# Patient Record
Sex: Female | Born: 1990
Health system: Southern US, Community
[De-identification: ages and names within clinical notes are randomized; demographics above are authoritative.]

## PROBLEM LIST (undated history)

## (undated) DIAGNOSIS — D649 Anemia, unspecified: Secondary | ICD-10-CM

## (undated) HISTORY — PX: MOLE REMOVAL: SHX2046

## (undated) HISTORY — PX: TOE SURGERY: SHX1073

---

## 2011-08-11 DIAGNOSIS — Z3490 Encounter for supervision of normal pregnancy, unspecified, unspecified trimester: Secondary | ICD-10-CM

## 2015-10-03 ENCOUNTER — Encounter (HOSPITAL_COMMUNITY): Payer: Self-pay | Admitting: Emergency Medicine

## 2015-10-03 ENCOUNTER — Ambulatory Visit (HOSPITAL_COMMUNITY)
Admission: EM | Admit: 2015-10-03 | Discharge: 2015-10-03 | Disposition: A | Discharge status: Home / Self Care | Payer: Self-pay | Attending: Family Medicine | Admitting: Family Medicine

## 2015-10-03 DIAGNOSIS — S39012A Strain of muscle, fascia and tendon of lower back, initial encounter: Secondary | ICD-10-CM

## 2015-10-03 MED ORDER — CYCLOBENZAPRINE HCL 5 MG PO TABS: ORAL_TABLET | ORAL | Status: DC

## 2015-10-03 MED ORDER — NAPROXEN 500 MG PO TABS: 500.0000 mg | ORAL_TABLET | Freq: Two times a day (BID) | ORAL | Status: DC

## 2015-10-03 NOTE — ED Notes (Signed)
[facility] with a complaint of bilateral lower back pain that has been ongoing for some time. 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 She stated that she has tried Ibuprofen and it provides minimal relief.

## 2015-10-03 NOTE — Discharge Instructions (Signed)

## 2015-10-03 NOTE — ED Provider Notes (Signed)
CSN: [id number]     Arrival date & time 10/03/15  1753 History   None    Chief Complaint  Patient presents with  . Back Pain   (Consider location/radiation/quality/duration/timing/severity/associated sxs/prior Treatment) Patient is a 25 y.o. female presenting with back pain. The history is provided by the patient.  Back Pain Location:  Lumbar spine Quality:  Aching Radiates to:  Does not radiate Pain severity:  Moderate Pain is:  Same all the time Onset quality:  Gradual Timing:  Constant Progression:  Worsening Chronicity:  Recurrent Context: physical stress   Relieved by:  Nothing Worsened by:  Twisting Ineffective treatments:  NSAIDs   History reviewed. No pertinent past medical history. History reviewed. No pertinent past surgical history. History reviewed. No pertinent family history. Social History  Substance Use Topics  . Smoking status: Never Smoker   . Smokeless tobacco: None  . Alcohol Use: No   OB History    No data available     Review of Systems  Constitutional: Negative.   HENT: Negative.   Eyes: Negative.   Respiratory: Negative.   Cardiovascular: Negative.   Gastrointestinal: Negative.   Endocrine: Negative.   Genitourinary: Negative.   Musculoskeletal: Positive for back pain.  Skin: Negative.   Allergic/Immunologic: Negative.   Neurological: Negative.   Hematological: Negative.   Psychiatric/Behavioral: Negative.     Allergies  Review of patient's allergies indicates no known allergies.  Home Medications   Prior to Admission medications   Medication Sig Start Date End Date Taking? Authorizing Provider  cyclobenzaprine (FLEXERIL) 5 MG tablet One po bid prn 10/03/15   Deatra CanterWilliam J Oxford, FNP  estradiol cypionate (DEPO-ESTRADIOL) 5 MG/ML injection Inject into the muscle every 28 (twenty-eight) days.    Historical Provider, MD  naproxen (NAPROSYN) 500 MG tablet Take 1 tablet (500 mg total) by mouth 2 (two) times daily. 10/03/15   Deatra CanterWilliam J  Oxford, FNP   Meds Ordered and Administered this Visit  Medications - No data to display  BP 118/63 mmHg  Pulse 88  Temp(Src) 98.4 F (36.9 C) (Oral)  Resp 12  SpO2 100% No data found.   Physical Exam  Constitutional: She appears well-developed and well-nourished.  HENT:  Head: Normocephalic and atraumatic.  Eyes: Pupils are equal, round, and reactive to light.  Neck: Normal range of motion.  Cardiovascular: Normal rate, regular rhythm and normal heart sounds.   Pulmonary/Chest: Effort normal and breath sounds normal.  Musculoskeletal: She exhibits tenderness.  TTP bilateral lumbar paraspinous muscles.  Decreased ROM lumbar spine.      ED Course  Procedures (including critical care time)  Labs Review Labs Reviewed - No data to display  Imaging Review No results found.   Visual Acuity Review  Right Eye Distance:   Left Eye Distance:   Bilateral Distance:    Right Eye Near:   Left Eye Near:    Bilateral Near:         MDM  Lumbar Strain  Naprosyn 500mg  one po bid x 10 days #20 Flexeril 5mg  one po bid prn #20 Need to take off work for 3 days - note given Follow up with PCP    Deatra CanterWilliam J Oxford, FNP 10/03/15 1851

## 2018-01-30 DIAGNOSIS — R5382 Chronic fatigue, unspecified: Secondary | ICD-10-CM | POA: Diagnosis not present

## 2018-07-11 DIAGNOSIS — Z3009 Encounter for other general counseling and advice on contraception: Secondary | ICD-10-CM | POA: Diagnosis not present

## 2018-07-11 DIAGNOSIS — R87612 Low grade squamous intraepithelial lesion on cytologic smear of cervix (LGSIL): Secondary | ICD-10-CM | POA: Diagnosis not present

## 2018-07-12 ENCOUNTER — Encounter (HOSPITAL_COMMUNITY): Payer: Self-pay | Admitting: *Deleted

## 2018-07-22 ENCOUNTER — Telehealth (HOSPITAL_COMMUNITY): Payer: Self-pay | Admitting: *Deleted

## 2018-07-22 ENCOUNTER — Encounter (HOSPITAL_COMMUNITY): Payer: Self-pay | Admitting: *Deleted

## 2018-07-22 NOTE — Telephone Encounter (Signed)
Telephoned patient at home number. Confirmed appointment for March 31. No symptoms of COVID-19. No travel outside of [place] in the last 14 days. No contact with someone with a confirmed diagnosis of COVID-19   

## 2018-07-23 ENCOUNTER — Ambulatory Visit (HOSPITAL_COMMUNITY): Payer: Self-pay

## 2018-08-19 ENCOUNTER — Ambulatory Visit: Payer: Self-pay | Admitting: Family Medicine

## 2018-10-17 ENCOUNTER — Ambulatory Visit (HOSPITAL_COMMUNITY): Payer: Self-pay

## 2018-10-21 ENCOUNTER — Ambulatory Visit: Payer: Self-pay | Admitting: Family Medicine

## 2018-10-22 ENCOUNTER — Ambulatory Visit (HOSPITAL_COMMUNITY): Payer: Self-pay

## 2018-11-05 ENCOUNTER — Encounter (HOSPITAL_COMMUNITY): Payer: Self-pay

## 2018-11-05 ENCOUNTER — Ambulatory Visit (HOSPITAL_COMMUNITY)
Admission: RE | Admit: 2018-11-05 | Discharge: 2018-11-05 | Disposition: A | Discharge status: Home / Self Care | Payer: Medicaid Other | Source: Ambulatory Visit | Attending: Obstetrics and Gynecology | Admitting: Obstetrics and Gynecology

## 2018-11-05 ENCOUNTER — Other Ambulatory Visit: Payer: Self-pay

## 2018-11-05 DIAGNOSIS — Z1239 Encounter for other screening for malignant neoplasm of breast: Secondary | ICD-10-CM | POA: Insufficient documentation

## 2018-11-05 DIAGNOSIS — R87612 Low grade squamous intraepithelial lesion on cytologic smear of cervix (LGSIL): Secondary | ICD-10-CM

## 2018-11-05 HISTORY — DX: Anemia, unspecified: D64.9

## 2018-11-05 NOTE — Patient Instructions (Signed)
Explained breast self awareness with Kathy Browning. Patient did not need a Pap smear today due to last Pap smear was 07/09/2018. Explained the colposcopy to patient the recommended follow-up for her abnormal Pap smear. Referred patient to the [facility] for a colposcopy to follow-up for her abnormal Pap smear. Appointment scheduled for Wednesday, November 06, 2018 at 1355. Patient aware of appointment and will be there. Let patient know a screening mammogram is recommended at age 29 unless clinically indicated prior. Discussed smoking cessation with patient. Referred to the [facility] Quitline and gave resources to the free smoking cessation classes at [facility]. Kathy Browning verbalized understanding.  Carlee Tesfaye, Arvil Chaco, RN 3:53 PM

## 2018-11-05 NOTE — Progress Notes (Signed)
Patient referred to Kathy Browning by the [facility] Department due to patient had an abnormal Pap smear on 07/09/2018 that a colposcopy is recommended for follow-up.  Pap Smear: Pap smear not completed today. Last Pap smear was 07/09/2018 at the [facility] and [facility]. Referred patient to the [facility] [place] for a colposcopy to follow-up for her abnormal Pap smear. Appointment scheduled for Wednesday, November 06, 2018 at 1355. Per patient has no history of an abnormal Pap smear prior to her most recent Pap smear. Last Pap smear result is in Epic.  Physical exam: Breasts Breasts symmetrical. No skin abnormalities bilateral breasts. No nipple retraction bilateral breasts. No nipple discharge bilateral breasts. No lymphadenopathy. No lumps palpated bilateral breasts. No complaints of pain or tenderness on exam. Screening mammogram recommended at age 83 unless clinically indicated prior.      Pelvic/Bimanual No Pap smear completed today since last Pap smear was 07/09/2018. Pap smear not indicated per BCCCP guidelines.   Smoking History: Patient is a current smoker. Discussed smoking cessation with patient. Referred to the [facility] Quitline and gave resources to the free smoking cessation classes at [facility].  Patient Navigation: Patient education provided. Access to services provided for patient through BCCCP program.   Breast and Cervical Cancer Risk Assessment: Patient has a family history of a maternal aunt having breast cancer. Patient has no known genetic mutations or history of radiation treatment to the chest before age 28. Patient has no history of cervical dysplasia, immunocompromised, or DES exposure in-utero. Breast cancer risk assessment completed. No breast cancer risk calculated due to patient is less than 78 years old.

## 2018-11-06 ENCOUNTER — Ambulatory Visit (INDEPENDENT_AMBULATORY_CARE_PROVIDER_SITE_OTHER): Payer: Self-pay | Admitting: Obstetrics & Gynecology

## 2018-11-06 ENCOUNTER — Other Ambulatory Visit (HOSPITAL_COMMUNITY)
Admission: RE | Admit: 2018-11-06 | Discharge: 2018-11-06 | Disposition: A | Discharge status: Home / Self Care | Payer: 59 | Source: Ambulatory Visit | Attending: Obstetrics & Gynecology | Admitting: Obstetrics & Gynecology

## 2018-11-06 VITALS — BP 130/90 | HR 90 | Temp 98.5°F | Wt 132.2 lb

## 2018-11-06 DIAGNOSIS — R87612 Low grade squamous intraepithelial lesion on cytologic smear of cervix (LGSIL): Secondary | ICD-10-CM | POA: Diagnosis not present

## 2018-11-06 LAB — POCT PREGNANCY, URINE: Preg Test, Ur: NEGATIVE

## 2018-11-06 NOTE — Progress Notes (Signed)
   Subjective:    Patient ID: Kathy Browning, female    DOB: Feb 27, 1991, 28 y.o.   MRN: [id number]  HPI 28 yo single P1 here for a colpo due to a LGSIL pap. She reports that she had the Gardasil series in the past.  Review of Systems She uses condoms for contraception.    Objective:   Physical Exam Breathing, conversing, and ambulating normally Well nourished, well hydrated Black female, no apparent distress UPT negative, consent signed, time out done Cervix prepped with acetic acid. Transformation zone seen in its entirety. Colpo adequate. Changes c/w LGSIL seen in a circumferencial fashion at the os (acetowhite changes) ECC obtained. She tolerated the procedure well.     Assessment & Plan:  LGSIL pap- c/w colpo finding Await ECC

## 2018-11-06 NOTE — Addendum Note (Signed)
Addended by: Emily Filbert on: 11/06/2018 02:42 PM   Modules accepted: Orders

## 2018-11-07 ENCOUNTER — Encounter: Payer: Self-pay | Admitting: *Deleted

## 2018-11-13 ENCOUNTER — Encounter (HOSPITAL_COMMUNITY): Payer: Self-pay | Admitting: *Deleted

## 2018-11-25 ENCOUNTER — Telehealth (HOSPITAL_COMMUNITY): Payer: Self-pay | Admitting: *Deleted

## 2018-11-25 NOTE — Telephone Encounter (Signed)
Telephoned patient at home number and left message to return call. Advised patient may reach [facility] at [phone] for results.

## 2020-04-06 ENCOUNTER — Emergency Department (HOSPITAL_COMMUNITY): Payer: Medicaid Other

## 2020-04-06 ENCOUNTER — Emergency Department (HOSPITAL_COMMUNITY)
Admission: EM | Admit: 2020-04-06 | Discharge: 2020-04-06 | Disposition: A | Discharge status: Home / Self Care | Payer: Medicaid Other | Attending: Emergency Medicine | Admitting: Emergency Medicine

## 2020-04-06 ENCOUNTER — Other Ambulatory Visit: Payer: Self-pay

## 2020-04-06 ENCOUNTER — Encounter (HOSPITAL_COMMUNITY): Payer: Self-pay

## 2020-04-06 DIAGNOSIS — M542 Cervicalgia: Secondary | ICD-10-CM | POA: Diagnosis not present

## 2020-04-06 DIAGNOSIS — S99921A Unspecified injury of right foot, initial encounter: Secondary | ICD-10-CM

## 2020-04-06 DIAGNOSIS — F17210 Nicotine dependence, cigarettes, uncomplicated: Secondary | ICD-10-CM | POA: Diagnosis not present

## 2020-04-06 MED ORDER — NAPROXEN 500 MG PO TABS: 500.0000 mg | ORAL_TABLET | Freq: Two times a day (BID) | ORAL | 0 refills | Status: AC

## 2020-04-06 NOTE — Progress Notes (Signed)
Orthopedic Tech Progress Note Patient Details:  Kathy Browning [birth year] [id number]  Ortho Devices Ortho Device/Splint Location: applied aso to RLE and crutches Ortho Device/Splint Interventions: Ordered,Application,Adjustment   Post Interventions Patient Tolerated: Well Instructions Provided: Care of device   Jennye Moccasin 04/06/2020, 9:17 PM
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
## 2020-04-13 ENCOUNTER — Ambulatory Visit (HOSPITAL_COMMUNITY)
Admission: RE | Admit: 2020-04-13 | Discharge: 2020-04-13 | Disposition: A | Discharge status: Home / Self Care | Payer: Self-pay | Source: Ambulatory Visit | Attending: Urgent Care | Admitting: Urgent Care

## 2020-04-13 ENCOUNTER — Telehealth: Payer: Self-pay | Admitting: Podiatry

## 2020-04-13 ENCOUNTER — Other Ambulatory Visit: Payer: Self-pay

## 2020-04-13 ENCOUNTER — Ambulatory Visit (INDEPENDENT_AMBULATORY_CARE_PROVIDER_SITE_OTHER): Payer: Self-pay

## 2020-04-13 ENCOUNTER — Encounter (HOSPITAL_COMMUNITY): Payer: Self-pay

## 2020-04-13 VITALS — BP 130/81 | HR 118 | Temp 98.8°F | Resp 17

## 2020-04-13 DIAGNOSIS — M79671 Pain in right foot: Secondary | ICD-10-CM

## 2020-04-13 DIAGNOSIS — S92244A Nondisplaced fracture of medial cuneiform of right foot, initial encounter for closed fracture: Secondary | ICD-10-CM

## 2020-04-13 MED ORDER — HYDROCODONE-ACETAMINOPHEN 5-325 MG PO TABS: 1.0000 | ORAL_TABLET | Freq: Four times a day (QID) | ORAL | 0 refills | Status: DC | PRN

## 2020-04-13 MED ORDER — NAPROXEN 500 MG PO TABS: 500.0000 mg | ORAL_TABLET | Freq: Two times a day (BID) | ORAL | 0 refills | Status: DC
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
## 2020-04-13 NOTE — Discharge Instructions (Signed)
Please schedule naproxen twice daily with food for your severe pain.  If you still have pain despite taking naproxen regularly, this is breakthrough pain.  You can use hydrocodone, a narcotic pain medicine, once every 4-6 hours for this.  Once your pain is better controlled, switch back to just naproxen.    [facility] () Podiatrist in [place], [place] COVID-19 info: [email or web address] Get online care: [email or web address] Address: [address] [place], [place], [place] [address] Phone: [phone] Appointments: [email or web address]   [facility], [place], [place] Doctor in [place], [place] Address: [address] [place], [place] [address] Phone: [phone]
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
## 2020-04-26 ENCOUNTER — Other Ambulatory Visit: Payer: Self-pay

## 2020-04-26 ENCOUNTER — Encounter: Payer: Self-pay | Admitting: Podiatry

## 2020-04-26 ENCOUNTER — Ambulatory Visit: Payer: Medicaid Other | Admitting: Podiatry

## 2020-04-26 ENCOUNTER — Ambulatory Visit: Payer: Medicaid Other

## 2020-04-26 DIAGNOSIS — S99921A Unspecified injury of right foot, initial encounter: Secondary | ICD-10-CM

## 2020-04-26 DIAGNOSIS — S92244A Nondisplaced fracture of medial cuneiform of right foot, initial encounter for closed fracture: Secondary | ICD-10-CM

## 2020-04-26 DIAGNOSIS — S93621A Sprain of tarsometatarsal ligament of right foot, initial encounter: Secondary | ICD-10-CM
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
## 2020-04-26 NOTE — Patient Instructions (Signed)
Lisfranc Injury  A Lisfranc injury is a break (fracture), separation (dislocation), or sprain involving the bones and joints in the middle of your foot. This is also known as midfoot injury. Your midfoot is made up of a cluster of small bones (tarsals)that attach to the long bones going to your toes (metatarsals). Strong bands of tissue (ligaments) attach the bones of your midfoot to each other. A Lisfranc injury can include:  Ligament damage or tears.  Bone fractures.  Dislocations.  A combination of these injuries. This type of injury can cause foot pain and instability. The condition can range from mild to severe. What are the causes? This condition may be caused by:  An injury that twists your foot forcefully.  Tripping or falling over your foot.  Falling from a height.  A hard, direct hit or a crushing injury to your foot. What increases the risk? You are more likely to develop this condition if you participate in:  Contact sports, especially while wearing cleats. This includes football.  Activities in which your foot is loaded in a pointed position, like dance and gymnastics. What are the signs or symptoms? Symptoms of this condition include:  Pain.  Swelling.  Bruising.  Seeing or feeling a new bump on the top of your foot. How is this diagnosed? This condition is diagnosed based on your symptoms, your medical history, and a physical exam. You may also have imaging studies done, including:  X-rays.  CT scans.  MRI. How is this treated? The first treatments for ligament injuries that do not cause instability or dislocation of the midfoot are usually nonsurgical. These may include:  Using a boot or cast to keep your foot still and protect it while it heals (immobilization).  Using crutches to keep weight off your foot.  Doing physical therapy to improve motion and strength.  Taking medicine for pain. Surgery is the treatment for fractures, dislocations,  and unstable ligament injuries. This may include ligament repair, joint fusion, or a procedure to stabilize the fracture using screws or plates. After surgery, you will have to wear a cast and eventually have physical therapy. Follow these instructions at home: Medicines  Take over-the-counter and prescription medicines only as told by your health care provider.  Ask your health care provider if the medicine prescribed to you requires you to avoid driving or using heavy machinery. If you have a boot:  Wear the boot as told by your health care provider. Remove it only as told by your health care provider.  Loosen the boot if your toes tingle, become numb, or turn cold and blue.  Keep the boot clean and dry. If you have a cast:  Do not stick anything inside the cast to scratch your skin. Doing that increases your risk of infection.  Check the skin around the cast every day. Tell your health care provider about any concerns.  You may put lotion on dry skin around the edges of the cast. Do not put lotion on the skin underneath the cast.  Keep the cast clean and dry.  Do not put pressure on any part of the cast until it is fully hardened. This may take several hours. Bathing  Do not take baths, swim, or use a hot tub until your health care provider approves.  If the cast or boot is not waterproof: ? Do not let it get wet. ? Cover it with a watertight covering when you take a bath or shower. Managing pain, stiffness, and  swelling   If directed, put ice on the injured area. ? If you have a removable boot, remove it as told by your health care provider. ? Put ice in a plastic bag. ? Place a towel between your skin and the bag or between your cast and the bag. ? Leave the ice on for 20 minutes, 2-3 times a day.  Move your toes often to reduce stiffness and swelling.  Raise (elevate) the injured area above the level of your heart while you are sitting or lying down. Activity  Do  not use the injured limb to support your body weight until your health care provider says that you can. Use crutches as told by your health care provider.  Do exercises as told by your health care provider.  Return to your normal activities as told by your health care provider. Ask your health care provider what activities are safe for you. General instructions  Take over-the-counter and prescription medicines only as told by your health care provider.  Ask your health care provider when it is safe to drive if you have a boot or cast on your foot.  Do not use any products that contain nicotine or tobacco, such as cigarettes, e-cigarettes, and chewing tobacco. These can delay bone healing. If you need help quitting, ask your health care provider.  Keep all follow-up visits as told by your health care provider. This is important. How is this prevented?  Warm up and stretch before being active.  Cool down and stretch after being active.  Use footwear that is appropriate for your athletic activity and the playing surface.  Be safe and responsible while being active. This will help you avoid falls. Contact a health care provider if:  Your pain medicine and rest are not helping. Get help right away if:  Your foot becomes very painful or numb.  Your toes become very pale or turn blue. Summary  A Lisfranc injury is a break (fracture), separation (dislocation), or sprain involving the bones and joints in the middle of your foot. This is also known as midfoot injury.  You are more likely to develop this condition if you participate in contact sports or activities in which your foot is loaded in a pointed position, like dance and gymnastics.  This type of injury can cause foot pain and instability. The condition can range from mild to severe.  This condition is treated with a boot or cast, physical therapy, crutches, medicine for pain, and surgery if needed.  Get help right away if your  foot becomes very painful or numb or if your toes become very pale or turn blue. This information is not intended to replace advice given to you by your health care provider. Make sure you discuss any questions you have with your health care provider. Document Revised: 08/02/2018 Document Reviewed: 03/07/2018 Elsevier Patient Education  2020 [employer].

## 2020-04-28 ENCOUNTER — Telehealth: Payer: Self-pay | Admitting: Podiatry

## 2020-04-28 ENCOUNTER — Encounter: Payer: Self-pay | Admitting: Podiatry

## 2020-04-28 NOTE — Telephone Encounter (Signed)
Patients job needs to know how long she will need to be in boot. How long she needs to have restrictions at work. Please advise?

## 2020-04-28 NOTE — Telephone Encounter (Signed)
Please go ahead and do 6 weeks for now

## 2020-04-29 ENCOUNTER — Telehealth: Payer: Self-pay | Admitting: *Deleted

## 2020-04-29 NOTE — Telephone Encounter (Signed)
Patient has an appointment on 05/07/2020 at 4:30 pm at [facility]. Kathy Browning

## 2020-04-29 NOTE — Telephone Encounter (Signed)
-----   Message from Vivi Barrack, DPM sent at 04/26/2020 12:03 PM EST ----- I have ordered a CT scan if you could please follow up on this. Thank you!

## 2020-05-07 ENCOUNTER — Ambulatory Visit
Admission: RE | Admit: 2020-05-07 | Discharge: 2020-05-07 | Disposition: A | Discharge status: Home / Self Care | Payer: Self-pay | Source: Ambulatory Visit | Attending: Podiatry | Admitting: Podiatry

## 2020-05-07 DIAGNOSIS — S93621A Sprain of tarsometatarsal ligament of right foot, initial encounter: Secondary | ICD-10-CM

## 2020-05-17 ENCOUNTER — Ambulatory Visit (INDEPENDENT_AMBULATORY_CARE_PROVIDER_SITE_OTHER): Payer: Medicaid Other | Admitting: Podiatry

## 2020-05-17 ENCOUNTER — Other Ambulatory Visit: Payer: Self-pay

## 2020-05-17 DIAGNOSIS — S93324A Dislocation of tarsometatarsal joint of right foot, initial encounter: Secondary | ICD-10-CM | POA: Diagnosis not present

## 2020-05-17 DIAGNOSIS — S92221A Displaced fracture of lateral cuneiform of right foot, initial encounter for closed fracture: Secondary | ICD-10-CM | POA: Diagnosis not present

## 2020-05-18 ENCOUNTER — Encounter: Payer: Self-pay | Admitting: *Deleted

## 2020-05-18 ENCOUNTER — Encounter: Payer: Self-pay | Admitting: Podiatry
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
## 2020-05-24 ENCOUNTER — Ambulatory Visit: Payer: Medicaid Other | Admitting: Podiatry

## 2020-05-25 ENCOUNTER — Ambulatory Visit: Payer: Medicaid Other | Admitting: Podiatry

## 2020-05-31 ENCOUNTER — Telehealth: Payer: Self-pay | Admitting: Podiatry
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
## 2020-06-02 ENCOUNTER — Encounter: Payer: Medicaid Other | Admitting: Student

## 2020-06-07 ENCOUNTER — Other Ambulatory Visit: Payer: Self-pay

## 2020-06-07 ENCOUNTER — Encounter: Payer: Self-pay | Admitting: Podiatry

## 2020-06-07 ENCOUNTER — Ambulatory Visit (INDEPENDENT_AMBULATORY_CARE_PROVIDER_SITE_OTHER): Payer: Medicaid Other

## 2020-06-07 ENCOUNTER — Ambulatory Visit (INDEPENDENT_AMBULATORY_CARE_PROVIDER_SITE_OTHER): Payer: Medicaid Other | Admitting: Podiatry

## 2020-06-07 DIAGNOSIS — S92244D Nondisplaced fracture of medial cuneiform of right foot, subsequent encounter for fracture with routine healing: Secondary | ICD-10-CM

## 2020-06-07 DIAGNOSIS — S92221D Displaced fracture of lateral cuneiform of right foot, subsequent encounter for fracture with routine healing: Secondary | ICD-10-CM | POA: Diagnosis not present

## 2020-06-07 DIAGNOSIS — S93324D Dislocation of tarsometatarsal joint of right foot, subsequent encounter: Secondary | ICD-10-CM
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
## 2020-06-07 NOTE — Patient Instructions (Signed)
A physical therapy referral will be sent to:   [address] [place],  [place], [place] [address]   [phone]

## 2020-06-22 ENCOUNTER — Ambulatory Visit: Payer: Medicaid Other | Attending: Podiatry

## 2020-06-22 ENCOUNTER — Other Ambulatory Visit: Payer: Self-pay

## 2020-06-22 DIAGNOSIS — M6281 Muscle weakness (generalized): Secondary | ICD-10-CM | POA: Diagnosis present

## 2020-06-22 DIAGNOSIS — M25571 Pain in right ankle and joints of right foot: Secondary | ICD-10-CM | POA: Insufficient documentation
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
## 2020-06-22 NOTE — Patient Instructions (Signed)
Access Code: [email or web address] URL: [email or web address] Date: 06/22/2020 Prepared by: Gardiner Rhyme  Exercises Seated Toe Towel Scrunches - 2 x daily - 7 x weekly - 1 sets - 10 reps Seated Arch Lifts - 3-5 x daily - 7 x weekly - 1-2 sets - 10 reps - 5 seconds hold Toe Spreading - 2 x daily - 7 x weekly - 1 sets - 10 reps

## 2020-06-28 ENCOUNTER — Ambulatory Visit: Payer: Medicaid Other | Admitting: Physical Therapy

## 2020-07-05 ENCOUNTER — Ambulatory Visit (INDEPENDENT_AMBULATORY_CARE_PROVIDER_SITE_OTHER): Payer: Medicaid Other | Admitting: Podiatry

## 2020-07-05 ENCOUNTER — Encounter: Payer: Self-pay | Admitting: Podiatry

## 2020-07-05 ENCOUNTER — Other Ambulatory Visit: Payer: Self-pay

## 2020-07-05 DIAGNOSIS — S92244D Nondisplaced fracture of medial cuneiform of right foot, subsequent encounter for fracture with routine healing: Secondary | ICD-10-CM

## 2020-07-05 DIAGNOSIS — S92221D Displaced fracture of lateral cuneiform of right foot, subsequent encounter for fracture with routine healing: Secondary | ICD-10-CM | POA: Diagnosis not present

## 2020-07-05 DIAGNOSIS — S93324D Dislocation of tarsometatarsal joint of right foot, subsequent encounter: Secondary | ICD-10-CM | POA: Diagnosis not present

## 2020-07-07 ENCOUNTER — Ambulatory Visit: Payer: Medicaid Other | Admitting: Rehabilitative and Restorative Service Providers"

## 2020-07-07 ENCOUNTER — Encounter: Payer: Self-pay | Admitting: Rehabilitative and Restorative Service Providers"

## 2020-07-07 ENCOUNTER — Other Ambulatory Visit: Payer: Self-pay

## 2020-07-07 DIAGNOSIS — M6281 Muscle weakness (generalized): Secondary | ICD-10-CM

## 2020-07-07 DIAGNOSIS — M25571 Pain in right ankle and joints of right foot: Secondary | ICD-10-CM
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
## 2020-07-13 ENCOUNTER — Ambulatory Visit: Payer: Medicaid Other

## 2020-07-22 ENCOUNTER — Ambulatory Visit: Payer: Medicaid Other

## 2020-07-28 ENCOUNTER — Ambulatory Visit: Payer: Medicaid Other | Admitting: Rehabilitative and Restorative Service Providers"

## 2020-08-04 ENCOUNTER — Telehealth: Payer: Self-pay | Admitting: Rehabilitative and Restorative Service Providers"

## 2020-08-04 ENCOUNTER — Ambulatory Visit: Payer: Medicaid Other | Attending: Podiatry | Admitting: Rehabilitative and Restorative Service Providers"

## 2020-08-04 NOTE — Telephone Encounter (Signed)
PT phoned patient and let her know that she missed her appt today. She said she has had to cancel several b/c she couldn't get off of work. PT reminded her of her next appt. She acknowledged the next appt.

## 2020-08-10 ENCOUNTER — Ambulatory Visit: Payer: Medicaid Other

## 2020-08-16 ENCOUNTER — Ambulatory Visit (INDEPENDENT_AMBULATORY_CARE_PROVIDER_SITE_OTHER): Payer: Medicaid Other | Admitting: Podiatry

## 2020-08-16 ENCOUNTER — Other Ambulatory Visit: Payer: Self-pay

## 2020-08-16 DIAGNOSIS — S93324D Dislocation of tarsometatarsal joint of right foot, subsequent encounter: Secondary | ICD-10-CM | POA: Diagnosis not present

## 2020-08-16 DIAGNOSIS — S92221D Displaced fracture of lateral cuneiform of right foot, subsequent encounter for fracture with routine healing: Secondary | ICD-10-CM

## 2020-08-16 DIAGNOSIS — S92244D Nondisplaced fracture of medial cuneiform of right foot, subsequent encounter for fracture with routine healing: Secondary | ICD-10-CM

## 2020-08-16 NOTE — Patient Instructions (Signed)
Look for the [facility] Physician Fee Schedule for [place] for CPT code 93818 for midtarsal arthrodesis. This would be the maximum amount paid to my office for the surgery. There are additional costs from the surgery center and the anesthesia group that would be more difficult to estimate.

## 2020-08-17 ENCOUNTER — Encounter: Payer: Self-pay | Admitting: Podiatry
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
## 2020-10-19 ENCOUNTER — Ambulatory Visit: Payer: Medicaid Other | Admitting: Podiatry

## 2020-11-18 ENCOUNTER — Ambulatory Visit (INDEPENDENT_AMBULATORY_CARE_PROVIDER_SITE_OTHER): Payer: Medicaid Other | Admitting: Podiatry

## 2020-11-18 ENCOUNTER — Other Ambulatory Visit: Payer: Self-pay

## 2020-11-18 DIAGNOSIS — S92221D Displaced fracture of lateral cuneiform of right foot, subsequent encounter for fracture with routine healing: Secondary | ICD-10-CM

## 2020-11-18 DIAGNOSIS — S93324D Dislocation of tarsometatarsal joint of right foot, subsequent encounter: Secondary | ICD-10-CM | POA: Diagnosis not present

## 2020-11-18 DIAGNOSIS — S92244D Nondisplaced fracture of medial cuneiform of right foot, subsequent encounter for fracture with routine healing: Secondary | ICD-10-CM | POA: Diagnosis not present
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
## 2021-01-11 DIAGNOSIS — M79676 Pain in unspecified toe(s): Secondary | ICD-10-CM

## 2021-04-19 ENCOUNTER — Ambulatory Visit (INDEPENDENT_AMBULATORY_CARE_PROVIDER_SITE_OTHER): Payer: Medicaid Other | Admitting: *Deleted

## 2021-04-19 ENCOUNTER — Other Ambulatory Visit: Payer: Self-pay

## 2021-04-19 VITALS — BP 122/71 | HR 93 | Temp 97.6°F | Ht 63.0 in | Wt 140.2 lb

## 2021-04-19 DIAGNOSIS — Z3201 Encounter for pregnancy test, result positive: Secondary | ICD-10-CM

## 2021-04-19 DIAGNOSIS — Z3480 Encounter for supervision of other normal pregnancy, unspecified trimester: Secondary | ICD-10-CM | POA: Diagnosis not present

## 2021-04-19 LAB — POCT URINE PREGNANCY: Preg Test, Ur: POSITIVE — AB

## 2021-04-19 MED ORDER — PRENATAL PLUS VITAMIN/MINERAL 27-1 MG PO TABS: 1.0000 | ORAL_TABLET | Freq: Every day | ORAL | 12 refills | Status: DC

## 2021-04-19 NOTE — Patient Instructions (Addendum)
Please remember that if you are not able to keep your appointment to call the office and reschedule, or cancel. If you no-show or cancel with less than 24 hour notice we will charge you, not your insurance a $50 fee.   Safe Medications in Pregnancy   Acne: Benzoyl Peroxide Salicylic Acid  Backache/Headache: Tylenol: 2 regular strength every 4 hours OR              2 Extra strength every 6 hours  Colds/Coughs/Allergies: Benadryl (alcohol free) 25 mg every 6 hours as needed Breath right strips Claritin Cepacol throat lozenges Chloraseptic throat spray Cold-Eeze- up to three times per day Cough drops, alcohol free Flonase (by prescription only) Guaifenesin Mucinex Robitussin DM (plain only, alcohol free) Saline nasal spray/drops Sudafed (pseudoephedrine) & Actifed ** use only after [redacted] weeks gestation and if you do not have high blood pressure Tylenol Vicks Vaporub Zinc lozenges Zyrtec   Constipation: Colace Ducolax suppositories Fleet enema Glycerin suppositories Metamucil Milk of magnesia Miralax Senokot Smooth move tea  Diarrhea: Kaopectate Imodium A-D  *NO pepto Bismol  Hemorrhoids: Anusol Anusol HC Preparation H Tucks  Indigestion: Tums Maalox Mylanta Zantac  Pepcid  Insomnia: Benadryl (alcohol free) 25mg  every 6 hours as needed Tylenol PM Unisom, no Gelcaps  Leg Cramps: Tums MagGel  Nausea/Vomiting:  Bonine Dramamine Emetrol Ginger extract Sea bands Meclizine  Nausea medication to take during pregnancy:  Unisom (doxylamine succinate 25 mg tablets) Take one tablet daily at bedtime. If symptoms are not adequately controlled, the dose can be increased to a maximum recommended dose of two tablets daily (1/2 tablet in the morning, 1/2 tablet mid-afternoon and one at bedtime). Vitamin B6 100mg  tablets. Take one tablet twice a day (up to 200 mg per day).  Skin Rashes: Aveeno products Benadryl cream or 25mg  every 6 hours as  needed Calamine Lotion 1% cortisone cream  Yeast infection: Gyne-lotrimin 7 Monistat 7   **If taking multiple medications, please check labels to avoid duplicating the same active ingredients **take medication as directed on the label ** Do not exceed 4000 mg of tylenol in 24 hours **Do not take medications that contain aspirin or ibuprofen      AREA FAMILY PRACTICE PHYSICIANS  Central/[place]outheast [place] ( ) [facility] [address] [place], [address] [place] [phone] Mon-Fri 8:30-12:30, 1:30-5:00 Accepting [facility] Family [facility] at [facility] [address], [place], [facility] [address] [phone] Mon-Fri 8:00-5:30 [place] [facility] [address], [place], [facility] [address] [phone] Mon, Tue, Thur, Fri 8:30-5:00, Wed 10:00-7:00 (closed 1-2pm) Accepting [facility] [address], [place], [facility]  [address] Phone - [phone]   Fax - [phone]  [place] [phone]) [facility] [address] [place], [facility] [address] [phone] Mon-Fri 8:00-5:00 [facility] - [facility] at [facility])  [address] [facility] [place], [address] Sherian Maroon [phone] Mon-Fri 8:30-5:30, Sat (Oct.-Mar.) 9:00-1:00 Accepting Medicaid  [place] [phone]) [facility] at [facility] [address] [place], [facility] [address] [phone] Mon-Fri 8:00-5:00  [place] [phone]) [facility] Medicine at [facility] [address] [place], [facility] [address] [phone] Mon-Fri 8:00-5:00 [facility] [address] [place], [place], [place] [phone] Mon-Fri 8:00-5:00 [place] [facility] [address] [phone] [place], [address] [place] [phone] Mon-Fri 8:00-5:00 [facility] [address] [place]  [place] [address] [place] [phone] Mon-Fri 7:30-5:30  [place] [phone] & [phone]) [place]  [facility] [address], [place], [place] [address] [phone] Mon-Thur 8:00-6:00 Accepting [facility] [address] [place], [place] [address] [phone] Mon-Thur 7:30-7:30, Fri 7:30-4:30 Accepting [facility] Family Medicine at [facility] [address] [place], [place]  [address] [phone]   Fax - [phone]  [place] [phone] & [phone]) Adult nurse HealthCare at [facility] [address] [place], [place] [address] [phone] Mon-Fri 7:00-5:00 [facility] Family Medicine [address], [place], [place] [address] [phone] Mon-Fri 8:00-5:00 Accepting Medicaid [facility] - [facility] [address] [place], [place], [place] [address] [phone] Mon-Fri 8:00-5:00 Accepting Medicaid  [place] [phone]) [facility] Primary Care at [facility] [address] [place], [place] [address] [phone] Mon-Fri 8:00-5:00 [facility] - [facility] at [facility]) [address], [place], [place] [address] [phone] Mon-Fri 8:00-5:00 Accepting Medicaid [facility] - [facility] ([facility] [employer]) [address], [place], [place] [address] [phone] Mon-Fri 8:00-5:30, Sat&Sun by appointment (phones open at 8:30) Accepting [facility] [phone] & [phone]) [facility] Family Medicine [address], [place], [place] [address] [phone] Mon-Thur 8:00-7:00, Fri 8:00-5:00, Sat 8:00-12:00, Sun 9:00-12:00 Accepting Medicaid [facility] - Family Medicine at [facility] [address] Mer[address] Mattes [place], [place] [address] [phone] Mon-Thur 8:00-5:00 Accepting Medicaid [facility] -  Family Medicine at [place] [address] Sherian Maroon [place], [place] [address] [phone] Mon-Fri 8:00-5:30, Sat (Oct.-Mar.) 9:00-1:00 Accepting [facility]  [place] [phone]) [facility] Medicine [address] [place], [place] [address] [phone] Mon-Fri 8:00-5:00 Accepting [facility]   [place] [phone]) [facility] at [facility] [address], [place], [place] [address] [phone] Mon-Fri 8:00-5:00 [facility] [address] Clint Lipps [place], [place] [address] [phone] Mon-Fri 8:00-5:00 [facility] Pediatrics - [place] [address] [facility] [address], [place], [place] [address] [phone] Mon-Fri 8:00-5:00 After hours clinic [facility] [place], [place] [address]) [phone] Mon-Fri 5:00-8:00, Sat 12:00-6:00, Sun 10:00-4:00 Accepting Medicaid [facility] at [facility]. [address], [place], [place]  [address] [phone]   Fax - [phone]  [place] [phone]) Adult nurse HealthCare at [facility] [address] [place], [place], [place] [address] [phone] Mon-Fri 8:00-5:00 [facility] Family Medicine - [facility] at [place]) [address] [place] [address] [place], [place] [address] [phone] Mon-Thur 8:00-7:00, Fri 8:00-5:00, Sat 8:00-12:00   .cwh

## 2021-04-19 NOTE — Progress Notes (Signed)
° ° °  I discussed the limitations, risks, security and privacy concerns of performing an evaluation and management service by telephone and the availability of in person appointments. I also discussed with the patient that there may be a patient responsible charge related to this service. The patient expressed understanding and agreed to proceed.   Location: [facility] Patient: clinic Provider: clinic Interpreter used: no professional interpreter  PRENATAL INTAKE SUMMARY  Ms. Kathy Browning presents today New OB Nurse Interview.  OB History     Gravida  3   Para      Term      Preterm      AB  2   Living  1      SAB  1   IAB  1   Ectopic      Multiple      Live Births  1          I have reviewed the patient's medical, obstetrical, social, and family histories, medications, and available lab results.  SUBJECTIVE She complains of backache  OBJECTIVE Initial Nurse interview for history/labs (New OB)  last menstrual period date: 02/10/2021 EDD: 11/17/2021 by LMP  GA: [redacted]w[redacted]d G3P0021 FHT: not assessed due to gestational age  GENERAL APPEARANCE: alert, well appearing, in mild to moderate distress   ASSESSMENT Positive UPT Normal pregnancy  PLAN Prenatal care:  [facility] PAP: approximate date unknown and was normal Labs to be completed at next visit with Midwife Advised to be seen at urgent care for back pain. Rx PNV to pharmacy   Blood Pressure Monitor/Weight Scale To discuss at next vist  MyChart/Babyscripts MyChart access verified. I explained pt will have some visits in office and some virtually. Babyscripts instructions given and order placed. Will discuss at next visit.  Placed OB Box on problem list and updated   Followup with Edd Arbour, CNM 05/04/2021   Follow Up Instructions:   I discussed the assessment and treatment plan with the patient. The patient was provided an opportunity to ask questions and all were answered. The  patient agreed with the plan and demonstrated an understanding of the instructions.   The patient was advised to call back or seek an in-person evaluation if the symptoms worsen or if the condition fails to improve as anticipated.  I provided 30 minutes of  face-to-face time during this encounter.  Clovis Pu, RN

## 2021-04-24 NOTE — L&D Delivery Note (Signed)
Patient: Kathy Browning MRN: [id number]  GBS status: Positive, IAP given  Patient is a 31 y.o. now G3P1011 s/p NSVD at [redacted]w[redacted]d, who was admitted for IOL 2/2 A2GDM, mild polyhydramnios. AROM 8h 53m prior to delivery with clear fluid.    Delivery Note At 1:18 PM a viable female was delivered via Vaginal, Spontaneous (Presentation: Left Occiput Anterior).  APGAR: , ; weight  .   Placenta status: Spontaneous, Intact.  Cord: 3 vessels with the following complications: None.  Cord pH: pending  Head delivered LOA. No nuchal cord present. Shoulder and body delivered in usual fashion. Infant with spontaneous cry, placed on mother's abdomen, dried and bulb suctioned. Cord clamped x 2 after 1-minute delay, and cut by family member. Cord blood drawn. Placenta delivered spontaneously with gentle cord traction. Fundus firm with massage and Pitocin. Perineum inspected and found to have 1st degree perineal and periurethral laceration, which was hemostatic following repair.  Anesthesia: Epidural Episiotomy: None Lacerations: 1st degree;Periurethral;Perineal Suture Repair: 3.0 vicryl on perineum, 4.0 monocryl on periurethral Est. Blood Loss (mL): 500  Mom to postpartum.  Baby to Couplet care / Skin to Skin.  Catlyn Shipton Autry-Lott 11/12/2021, 1:51 PM

## 2021-05-04 ENCOUNTER — Encounter: Payer: Self-pay | Admitting: Certified Nurse Midwife

## 2021-05-04 ENCOUNTER — Ambulatory Visit (INDEPENDENT_AMBULATORY_CARE_PROVIDER_SITE_OTHER): Payer: No Typology Code available for payment source | Admitting: Certified Nurse Midwife

## 2021-05-04 ENCOUNTER — Other Ambulatory Visit: Payer: Self-pay

## 2021-05-04 ENCOUNTER — Other Ambulatory Visit (HOSPITAL_COMMUNITY)
Admission: RE | Admit: 2021-05-04 | Discharge: 2021-05-04 | Disposition: A | Discharge status: Home / Self Care | Payer: No Typology Code available for payment source | Source: Ambulatory Visit | Attending: Certified Nurse Midwife | Admitting: Certified Nurse Midwife

## 2021-05-04 VITALS — BP 111/74 | HR 91 | Temp 98.0°F | Wt 142.8 lb

## 2021-05-04 DIAGNOSIS — Z3491 Encounter for supervision of normal pregnancy, unspecified, first trimester: Secondary | ICD-10-CM

## 2021-05-04 DIAGNOSIS — O219 Vomiting of pregnancy, unspecified: Secondary | ICD-10-CM

## 2021-05-04 DIAGNOSIS — Z3480 Encounter for supervision of other normal pregnancy, unspecified trimester: Secondary | ICD-10-CM | POA: Insufficient documentation

## 2021-05-04 DIAGNOSIS — Z3A11 11 weeks gestation of pregnancy: Secondary | ICD-10-CM

## 2021-05-04 DIAGNOSIS — J0110 Acute frontal sinusitis, unspecified: Secondary | ICD-10-CM | POA: Diagnosis not present

## 2021-05-04 MED ORDER — ONDANSETRON 4 MG PO TBDP: 4.0000 mg | ORAL_TABLET | Freq: Three times a day (TID) | ORAL | 0 refills | Status: DC | PRN

## 2021-05-04 MED ORDER — AMOXICILLIN 500 MG PO TABS: 500.0000 mg | ORAL_TABLET | Freq: Two times a day (BID) | ORAL | 0 refills | Status: DC

## 2021-05-04 MED ORDER — GUAIFENESIN ER 600 MG PO TB12
600.0000 mg | ORAL_TABLET | Freq: Two times a day (BID) | ORAL | 0 refills | Status: DC | PRN
Start: 2021-05-04 — End: 2021-07-20

## 2021-05-04 MED ORDER — SALINE SPRAY 0.65 % NA SOLN: 1.0000 | NASAL | 0 refills | Status: DC | PRN

## 2021-05-04 MED ORDER — BONJESTA 20-20 MG PO TBCR: 1.0000 | EXTENDED_RELEASE_TABLET | Freq: Every day | ORAL | 2 refills | Status: DC

## 2021-05-04 NOTE — Progress Notes (Signed)
History:   Kathy Browning is a 31 y.o. G3P1011 at [redacted]w[redacted]d by LMP being seen today for her first obstetrical visit.  Her obstetrical history is significant for  none . Patient does intend to breast feed. Pregnancy history fully reviewed.  Patient reports  nausea/vomiting especially after bad post-nasal drainage, constant severe congestion with thick rhinorrhea, dizziness and headaches x3wks. Has not had a fever but cannot get relief from the congestion .   HISTORY: OB History  Gravida Para Term Preterm AB Living  3 1 1  0 1 1  SAB IAB Ectopic Multiple Live Births  0 1 0 0 1    # Outcome Date GA Lbr Len/2nd Weight Sex Delivery Anes PTL Lv  3 Current           2 IAB 2014          1 Term 08/11/11 [redacted]w[redacted]d  6 lb 7 oz (2.92 kg) M   N LIV     Complications: Encounter for induction of labor    Last pap smear was done March 2020 and was abnormal - LGSIL consistent with HPV   Past Medical History:  Diagnosis Date   Anemia    Past Surgical History:  Procedure Laterality Date   MOLE REMOVAL     TOE SURGERY     Family History  Problem Relation Age of Onset   Hypertension Mother    Hypertension Maternal Grandmother    Social History   Tobacco Use   Smoking status: Some Days    Types: Cigarettes    Passive exposure: Never   Smokeless tobacco: Never  Vaping Use   Vaping Use: Never used  Substance Use Topics   Alcohol use: Not Currently    Comment: occassionally   Drug use: Never   No Known Allergies Current Outpatient Medications on File Prior to Visit  Medication Sig Dispense Refill   Prenatal Vit-Fe Fumarate-FA (PRENATAL PLUS VITAMIN/MINERAL) 27-1 MG TABS Take 1 tablet by mouth daily at 6 (six) AM. (Patient not taking: Reported on 05/04/2021) 30 tablet 12   No current facility-administered medications on file prior to visit.    Review of Systems Pertinent items noted in HPI and remainder of comprehensive ROS otherwise negative. Physical Exam:   Vitals:   05/04/21 1525  BP:  111/74  Pulse: 91  Temp: 98 F (36.7 C)  Weight: 142 lb 12.8 oz (64.8 kg)   Fetal Heart Rate (bpm): 156  Physical Exam Vitals and nursing note reviewed. Exam conducted with a chaperone present.  Constitutional:      General: She is not in acute distress.    Appearance: Normal appearance. She is normal weight. She is not ill-appearing.  HENT:     Head: Normocephalic and atraumatic.     Nose: Congestion and rhinorrhea present.     Mouth/Throat:     Mouth: Mucous membranes are moist.     Pharynx: Oropharyngeal exudate (post-nasal drainage noted in back of throat) present.  Eyes:     Pupils: Pupils are equal, round, and reactive to light.     Comments: Eyes watery from sinus pressure  Cardiovascular:     Rate and Rhythm: Normal rate and regular rhythm.     Pulses: Normal pulses.  Pulmonary:     Effort: Pulmonary effort is normal.  Abdominal:     Palpations: Abdomen is soft.     Tenderness: There is no abdominal tenderness.  Genitourinary:    General: Normal vulva.  Musculoskeletal:  General: Normal range of motion.     Cervical back: Normal range of motion.  Skin:    General: Skin is warm and dry.     Capillary Refill: Capillary refill takes less than 2 seconds.  Neurological:     Mental Status: She is alert and oriented to person, place, and time.  Psychiatric:        Mood and Affect: Mood normal.        Behavior: Behavior normal.        Thought Content: Thought content normal.        Judgment: Judgment normal.    Assessment & Plan:  1. Supervision of other normal pregnancy, antepartum - Excited to be pregnant but not feeling well and ready to feel better. Desires to wait until her postpartum appointment for Pap due to how bad she feels today.  - CBC/D/Plt+RPR+Rh+ABO+RubIgG... - Culture, OB Urine - Hemoglobin A1c - Cervicovaginal ancillary only( [place]) - Genetic Screening  2. [redacted] weeks gestation of pregnancy - Routine OB care   3. Acute non-recurrent  frontal sinusitis - Length of illness and severity of drainage plus nausea/vomiting, dizziness and headaches consistent with infectious sinusitis. - amoxicillin (AMOXIL) 500 MG tablet; Take 1 tablet (500 mg total) by mouth 2 (two) times daily.  Dispense: 14 tablet; Refill: 0 - guaiFENesin (MUCINEX) 600 MG 12 hr tablet; Take 1 tablet (600 mg total) by mouth 2 (two) times daily as needed. Use for congestion until infection has cleared.  Dispense: 14 tablet; Refill: 0 - sodium chloride (OCEAN) 0.65 % SOLN nasal spray; Place 1 spray into both nostrils as needed for congestion. Use at bedtime and in the morning. Spray, then blow your nose.  Dispense: 30 mL; Refill: 0  4. Nausea and vomiting during pregnancy - Doxylamine-Pyridoxine ER (BONJESTA) 20-20 MG TBCR; Take 1 tablet by mouth at bedtime.  Dispense: 60 tablet; Refill: 2 - ondansetron (ZOFRAN-ODT) 4 MG disintegrating tablet; Take 1 tablet (4 mg total) by mouth every 8 (eight) hours as needed for nausea or vomiting. Use sparingly if antibiotics make you nauseated  Dispense: 15 tablet; Refill: 0  5. Initial obstetric visit in first trimester - Introduction and welcomed to the practice - Initial labs drawn. - Continue prenatal vitamins once able to tolerate them - Problem list reviewed and updated. - Genetic Screening discussed, First trimester screen, Quad screen, and NIPS: ordered. - Ultrasound discussed; fetal anatomic survey: ordered. - Anticipatory guidance about prenatal visits given including labs, ultrasounds, and testing. - Discussed usage of Babyscripts and virtual visits as additional source of managing and completing prenatal visits in midst of coronavirus and pandemic.   - Encouraged to complete MyChart Registration for her ability to review results, send requests, and have questions addressed.  - The nature of [place] [facility] Healthcare/Faculty Practice with multiple MDs and Advanced Practice Providers was explained to  patient; also emphasized that residents, students are part of our team. - Routine obstetric precautions reviewed. Encouraged to seek out care at office or emergency room [facility] preferred) for urgent and/or emergent concerns. Return in about 4 weeks (around 06/01/2021) for IN-PERSON, LOB.    Edd Arbour, MSN, CNM, IBCLC Certified Nurse Midwife, [facility]

## 2021-05-05 ENCOUNTER — Ambulatory Visit (INDEPENDENT_AMBULATORY_CARE_PROVIDER_SITE_OTHER): Payer: No Typology Code available for payment source | Admitting: Licensed Clinical Social Worker

## 2021-05-05 DIAGNOSIS — F4321 Adjustment disorder with depressed mood: Secondary | ICD-10-CM

## 2021-05-05 LAB — CERVICOVAGINAL ANCILLARY ONLY
Chlamydia: NEGATIVE
Comment: NEGATIVE
Comment: NEGATIVE
Comment: NORMAL
Neisseria Gonorrhea: NEGATIVE
Trichomonas: NEGATIVE

## 2021-05-05 LAB — CBC/D/PLT+RPR+RH+ABO+RUBIGG...
Antibody Screen: NEGATIVE
Basophils Absolute: 0.1 10*3/uL (ref 0.0–0.2)
Basos: 1 %
EOS (ABSOLUTE): 0.6 10*3/uL — ABNORMAL HIGH (ref 0.0–0.4)
Eos: 6 %
HCV Ab: <0.1 s/co ratio (ref 0.0–0.9)
HIV Screen 4th Generation wRfx: NONREACTIVE
Hematocrit: 36.5 % (ref 34.0–46.6)
Hemoglobin: 12.1 g/dL (ref 11.1–15.9)
Hepatitis B Surface Ag: NEGATIVE
Immature Grans (Abs): 0.1 10*3/uL (ref 0.0–0.1)
Immature Granulocytes: 1 %
Lymphocytes Absolute: 2.4 10*3/uL (ref 0.7–3.1)
Lymphs: 22 %
MCH: 31 pg (ref 26.6–33.0)
MCHC: 33.2 g/dL (ref 31.5–35.7)
MCV: 94 fL (ref 79–97)
Monocytes Absolute: 0.9 10*3/uL (ref 0.1–0.9)
Monocytes: 8 %
Neutrophils Absolute: 7.1 10*3/uL — ABNORMAL HIGH (ref 1.4–7.0)
Neutrophils: 62 %
Platelets: 312 10*3/uL (ref 150–450)
RBC: 3.9 x10E6/uL (ref 3.77–5.28)
RDW: 14 % (ref 11.7–15.4)
RPR Ser Ql: NONREACTIVE
Rh Factor: POSITIVE
Rubella Antibodies, IGG: 7.52 index (ref >0.99)
WBC: 11.1 10*3/uL — ABNORMAL HIGH (ref 3.4–10.8)

## 2021-05-05 LAB — HEMOGLOBIN A1C
Est. average glucose Bld gHb Est-mCnc: 97 mg/dL
Hgb A1c MFr Bld: 5 % (ref 4.8–5.6)

## 2021-05-05 LAB — HCV INTERPRETATION

## 2021-05-05 NOTE — BH Specialist Note (Signed)
Integrated Behavioral Health via Telemedicine Visit  05/05/2021 Kathy Browning [id number]  Number of Integrated Behavioral Health visits: 1 Session Start time: 200  Session End time: 227 Total time: 27 mins via mychart via phone   Referring Provider: Tyler Aas CNM Patient/Family location: Home  [facility] Provider location: [facility]  All persons participating in visit: [name] and LCSW A.Felton Clinton  Types of Service: phone and individual psychotherapy   I connected with Kathy Browning and/or Kathy Browning n/a via  Telephone or Video Enabled Telemedicine Application  (Video is Caregility application) and verified that I am speaking with the correct person using two identifiers. Discussed confidentiality: Yes   I discussed the limitations of telemedicine and the availability of in person appointments.  Discussed there is a possibility of technology failure and discussed alternative modes of communication if that failure occurs.  I discussed that engaging in this telemedicine visit, they consent to the provision of behavioral healthcare and the services will be billed under their insurance.  Patient and/or legal guardian expressed understanding and consented to Telemedicine visit: Yes   Presenting Concerns: Patient and/or family reports the following symptoms/concerns: depressed mood, fatigue and loss of interest  Duration of problem: approx 4 weeks ; Severity of problem: mild  Patient and/or Family's Strengths/Protective Factors: Concrete supports in place (healthy food, safe environments, etc.)  Goals Addressed: Patient will:  Reduce symptoms of: depression   Increase knowledge and/or ability of: coping skills   Demonstrate ability to: Increase healthy adjustment to current life circumstances  Progress towards Goals: Ongoing  Interventions: Interventions utilized:  Supportive Counseling Standardized Assessments completed: PHQ 9  Patient and/or Family Response: good    Assessment: Patient currently experiencing adjustment disorder with depressed mood .   Patient may benefit from integrated behavioral health .  Plan: Follow up with behavioral health clinician on : 3 weeks mychart  Behavioral recommendations: Prioritize rest, engage in stress reducing and self care activities to boost mood and reduce social isolation  Referral(s): Integrated [employer] (In Clinic)  I discussed the assessment and treatment plan with the patient and/or parent/guardian. They were provided an opportunity to ask questions and all were answered. They agreed with the plan and demonstrated an understanding of the instructions.   They were advised to call back or seek an in-person evaluation if the symptoms worsen or if the condition fails to improve as anticipated.  Kathy Saxon, LCSW

## 2021-05-06 LAB — CULTURE, OB URINE

## 2021-05-06 LAB — URINE CULTURE, OB REFLEX

## 2021-05-12 ENCOUNTER — Other Ambulatory Visit: Payer: Self-pay

## 2021-05-13 ENCOUNTER — Encounter: Payer: Self-pay | Admitting: Certified Nurse Midwife

## 2021-05-13 DIAGNOSIS — O219 Vomiting of pregnancy, unspecified: Secondary | ICD-10-CM

## 2021-05-13 DIAGNOSIS — J0110 Acute frontal sinusitis, unspecified: Secondary | ICD-10-CM

## 2021-05-13 DIAGNOSIS — Z3480 Encounter for supervision of other normal pregnancy, unspecified trimester: Secondary | ICD-10-CM

## 2021-05-16 MED ORDER — CEFDINIR 300 MG PO CAPS: 300.0000 mg | ORAL_CAPSULE | Freq: Two times a day (BID) | ORAL | 0 refills | Status: AC

## 2021-05-16 MED ORDER — ONDANSETRON 8 MG PO TBDP: 8.0000 mg | ORAL_TABLET | Freq: Three times a day (TID) | ORAL | 0 refills | Status: DC | PRN

## 2021-05-18 MED ORDER — PRENATAL PLUS VITAMIN/MINERAL 27-1 MG PO TABS: 1.0000 | ORAL_TABLET | Freq: Every day | ORAL | 12 refills | Status: DC

## 2021-05-18 NOTE — Addendum Note (Signed)
Addended by: Edd Arbour on: 05/18/2021 05:18 PM   Modules accepted: Orders

## 2021-05-24 ENCOUNTER — Telehealth: Payer: Self-pay

## 2021-05-24 NOTE — Telephone Encounter (Signed)
Received a call from [facility] requesting to speak to someone about her results. Informed patient she could send a MyChart message. She stated she did not know how to do that. Informed patient I would send a message to nurse.

## 2021-05-31 ENCOUNTER — Telehealth: Payer: Self-pay

## 2021-05-31 NOTE — Telephone Encounter (Signed)
Patient is requesting an ultrasound scheduling, and results for labs done. Informed patient she can speak to the provider about getting an ultrasound when she comes in for her appointment, as well as speaking to the nurse for lab results.

## 2021-06-01 ENCOUNTER — Other Ambulatory Visit (HOSPITAL_COMMUNITY)
Admission: RE | Admit: 2021-06-01 | Discharge: 2021-06-01 | Disposition: A | Discharge status: Home / Self Care | Payer: No Typology Code available for payment source | Source: Ambulatory Visit

## 2021-06-01 ENCOUNTER — Ambulatory Visit (INDEPENDENT_AMBULATORY_CARE_PROVIDER_SITE_OTHER): Payer: Medicaid Other

## 2021-06-01 ENCOUNTER — Other Ambulatory Visit: Payer: Self-pay

## 2021-06-01 VITALS — BP 119/72 | HR 105 | Temp 98.5°F | Wt 149.0 lb

## 2021-06-01 DIAGNOSIS — Z3A15 15 weeks gestation of pregnancy: Secondary | ICD-10-CM

## 2021-06-01 DIAGNOSIS — O26892 Other specified pregnancy related conditions, second trimester: Secondary | ICD-10-CM

## 2021-06-01 DIAGNOSIS — R300 Dysuria: Secondary | ICD-10-CM

## 2021-06-01 DIAGNOSIS — N898 Other specified noninflammatory disorders of vagina: Secondary | ICD-10-CM

## 2021-06-01 DIAGNOSIS — Z3480 Encounter for supervision of other normal pregnancy, unspecified trimester: Secondary | ICD-10-CM

## 2021-06-01 LAB — POCT URINALYSIS DIPSTICK
Bilirubin, UA: NEGATIVE
Blood, UA: NEGATIVE
Glucose, UA: NEGATIVE
Ketones, UA: NEGATIVE
Nitrite, UA: NEGATIVE
Odor: NEGATIVE
Protein, UA: NEGATIVE
Spec Grav, UA: 1.015 (ref 1.010–1.025)
Urobilinogen, UA: 0.2 E.U./dL
pH, UA: 7 (ref 5.0–8.0)

## 2021-06-01 MED ORDER — SALINE SPRAY 0.65 % NA SOLN: 1.0000 | NASAL | 1 refills | Status: DC | PRN

## 2021-06-01 MED ORDER — TERCONAZOLE 0.4 % VA CREA: 1.0000 | TOPICAL_CREAM | Freq: Every day | VAGINAL | 0 refills | Status: DC

## 2021-06-01 MED ORDER — PREPLUS 27-1 MG PO TABS: 1.0000 | ORAL_TABLET | Freq: Every day | ORAL | 13 refills | Status: DC

## 2021-06-01 NOTE — Progress Notes (Addendum)
LOW-RISK PREGNANCY OFFICE VISIT  Patient name: Legaci Tarman MRN [id number]  Date of birth: Dec 22, 1990 Chief Complaint:   Routine Prenatal Visit  Subjective:   Briony Parveen is a 31 y.o. G40P1011 female at [redacted]w[redacted]d with an Estimated Date of Delivery: 11/17/21 being seen today for ongoing management of a low-risk pregnancy aeb has Low grade squamous intraepith lesion on cytologic smear cervix (lgsil) and Supervision of other normal pregnancy, antepartum on their problem list.  Patient presents today with  pressure .  She states it has been within the last "week or two."  She reports she has been feeling something "hard move across my belly."   Patient endorses fetal movement. Patient denies abdominal cramping or contractions.  Patient reports vaginal concerns in the form of irritation, discharge, and odor that she describes as "yeast."  She also reports that the discharge is similar to yeast. She reports increased urination with some pressure.  Contractions: Not present. Vag. Bleeding: None.  Movement: Absent.  Reviewed past medical,surgical, social, obstetrical and family history as well as problem list, medications and allergies.  Objective   Vitals:   06/01/21 1514  BP: 119/72  Pulse: (!) 105  Temp: 98.5 F (36.9 C)  Weight: 149 lb (67.6 kg)  Body mass index is 26.39 kg/m.  Total Weight Gain:25 lb (11.3 kg)         Physical Examination:   General appearance: Well appearing, and in no distress  Mental status: Alert, oriented to person, place, and time  Skin: Warm & dry  Cardiovascular: Normal heart rate noted  Respiratory: Normal respiratory effort, no distress  Abdomen: Soft, gravid, nontender, AGA with fundus at U/-3  Pelvic: Cervical exam deferred           Extremities: Edema: None  Fetal Status: Fetal Heart Rate (bpm): 153  Movement: Absent   No results found for this or any previous visit (from the past 24 hour(s)).  Assessment & Plan:  Low-risk pregnancy of a 31 y.o.,  G3P1011 at [redacted]w[redacted]d with an Estimated Date of Delivery: 11/17/21   1. Supervision of other normal pregnancy, antepartum -Anticipatory guidance for upcoming appts. -Patient to schedule next appt in 4-6 weeks for an in-person visit. -Informed of office closing and need to transfer. -Reviewed reported and anticipated pregnancy complaints of this pregnancy in comparison to first.  Cautioned that things may occur sooner (ie. Aches, pains, feeling of being "bigger" sooner, etc).   2. [redacted] weeks gestation of pregnancy -Reviewed complaints.  -Avelina Laine results have yet to return. Company contacted directly.  [facility] reports representative to call patient with results tomorrow. -AFP today -Request for [place] also placed. -Patient requests and sent in refill for NS nasal spray and PNV.  3. Dysuria during pregnancy in second trimester -UC sent.  4. Vaginal discharge -Will collect CV -Rx for Terazol sent to pharmacy on file based on patient complaints. -Will treat other diagnoses accordingly.      Meds: No orders of the defined types were placed in this encounter.  Labs/procedures today:  Lab Orders         Culture, OB Urine         AFP, Serum, Open Spina Bifida         POCT Urinalysis Dipstick      Reviewed: Preterm labor symptoms and general obstetric precautions including but not limited to vaginal bleeding, contractions, leaking of fluid and fetal movement were reviewed in detail with the patient.  All questions were answered.  Follow-up: No  follow-ups on file.  Orders Placed This Encounter  Procedures   Culture, OB Urine   [place] MFM OB COMP + 14 WK   AFP, Serum, Open Spina Bifida   POCT Urinalysis Dipstick   Cherre Robins MSN, CNM 06/01/2021

## 2021-06-01 NOTE — Progress Notes (Signed)
Pt presents for ROB c/o dysuria and possible yeast infection x 1 week.

## 2021-06-03 LAB — CERVICOVAGINAL ANCILLARY ONLY
Bacterial Vaginitis (gardnerella): NEGATIVE
Candida Glabrata: NEGATIVE
Candida Vaginitis: POSITIVE — AB
Comment: NEGATIVE
Comment: NEGATIVE
Comment: NEGATIVE

## 2021-06-03 LAB — URINE CULTURE, OB REFLEX

## 2021-06-03 LAB — CULTURE, OB URINE

## 2021-06-23 ENCOUNTER — Other Ambulatory Visit: Payer: Self-pay

## 2021-06-23 ENCOUNTER — Ambulatory Visit (HOSPITAL_BASED_OUTPATIENT_CLINIC_OR_DEPARTMENT_OTHER): Payer: No Typology Code available for payment source | Admitting: Maternal & Fetal Medicine

## 2021-06-23 ENCOUNTER — Ambulatory Visit: Payer: No Typology Code available for payment source

## 2021-06-23 DIAGNOSIS — Z363 Encounter for antenatal screening for malformations: Secondary | ICD-10-CM | POA: Insufficient documentation

## 2021-06-23 DIAGNOSIS — Z3480 Encounter for supervision of other normal pregnancy, unspecified trimester: Secondary | ICD-10-CM

## 2021-06-23 DIAGNOSIS — Z3A15 15 weeks gestation of pregnancy: Secondary | ICD-10-CM | POA: Diagnosis not present

## 2021-06-23 DIAGNOSIS — Z3482 Encounter for supervision of other normal pregnancy, second trimester: Secondary | ICD-10-CM | POA: Diagnosis not present

## 2021-06-23 DIAGNOSIS — Q301 Agenesis and underdevelopment of nose: Secondary | ICD-10-CM | POA: Insufficient documentation

## 2021-06-23 DIAGNOSIS — Z3A19 19 weeks gestation of pregnancy: Secondary | ICD-10-CM | POA: Insufficient documentation

## 2021-06-23 NOTE — Progress Notes (Addendum)
MFM Brief Note ? ?Kathy Browning is a 31 yo G3P1 who is here at [facility] for a detailed exam at the request of Clyde Lundborg, CNM ? ?Kathy Browning is doing well today. She has a low risk NIPT. ? ?Single intrauterine pregnancy here for a detailed anatomy absent nasal bone ?Normal anatomy with measurements consistent with dates ?There is good fetal movement and the amniotic fluid was generous for gestational age. ? ?I discussed the nature of today's exam. In particular I discussed that the nasal bone was absent.  ? ?The fetal nasal bone has been demonstrated to be hypoplastic or absent in up to 60% of Down syndrome pregnancies imaged in the second trimester and only about 1% to 2% of unaffected pregnancies. Complete absence occurs in about 37% of affected cases, with hypoplasia occurring in about half. In normal pregnancies, absence is seen in 0.9% of cases and hypoplasia in 2.4%. NB length can be converted to a likelihood ratio and used for Down syndrome risk assessment. When performed by experienced operators, NB evaluation may be the best single ultrasound marker for second-trimester risk assessment. ? ?However given the context of no additional markers of aneuploidy and a low risk cell free DNA I explained that the overall risk were low for down syndrome. The association of down syndrome with today's finding was concerning for Kathy Browning and her significant other. We discussed the role of diagnostic amniocentesis in providing a sure answer to the risk of an absent nasal bone. We also discussed the 1:500 to 1:1000 of perinatal loss. While they were not happy about the information provided during this exam they declined the diagnostic amnioticentesis. ? ?I have scheduled them to return in 4 weeks to assess the fetal growth, amniotic fluid and to complete the fetal anatomy. ? ?I spent 20 minute with > 50% in face to face consulation  ? ?Kathy Olive, MD. ?

## 2021-06-24 ENCOUNTER — Other Ambulatory Visit: Payer: Self-pay | Admitting: *Deleted

## 2021-06-24 DIAGNOSIS — O283 Abnormal ultrasonic finding on antenatal screening of mother: Secondary | ICD-10-CM | POA: Insufficient documentation

## 2021-06-24 DIAGNOSIS — Q758 Other specified congenital malformations of skull and face bones: Secondary | ICD-10-CM

## 2021-06-29 ENCOUNTER — Other Ambulatory Visit: Payer: Self-pay

## 2021-06-29 ENCOUNTER — Ambulatory Visit (INDEPENDENT_AMBULATORY_CARE_PROVIDER_SITE_OTHER): Payer: Medicaid Other | Admitting: Family Medicine

## 2021-06-29 VITALS — BP 123/69 | HR 84 | Wt 156.1 lb

## 2021-06-29 DIAGNOSIS — J302 Other seasonal allergic rhinitis: Secondary | ICD-10-CM

## 2021-06-29 DIAGNOSIS — Z3480 Encounter for supervision of other normal pregnancy, unspecified trimester: Secondary | ICD-10-CM

## 2021-06-29 DIAGNOSIS — R519 Headache, unspecified: Secondary | ICD-10-CM

## 2021-06-29 DIAGNOSIS — R87612 Low grade squamous intraepithelial lesion on cytologic smear of cervix (LGSIL): Secondary | ICD-10-CM

## 2021-06-29 DIAGNOSIS — O26892 Other specified pregnancy related conditions, second trimester: Secondary | ICD-10-CM

## 2021-06-29 DIAGNOSIS — O283 Abnormal ultrasonic finding on antenatal screening of mother: Secondary | ICD-10-CM

## 2021-06-29 MED ORDER — LORATADINE 10 MG PO TABS: 10.0000 mg | ORAL_TABLET | Freq: Every day | ORAL | 5 refills | Status: DC

## 2021-06-29 MED ORDER — FLUTICASONE PROPIONATE 50 MCG/ACT NA SUSP: 1.0000 | Freq: Every day | NASAL | 2 refills | Status: DC

## 2021-06-29 MED ORDER — CYCLOBENZAPRINE HCL 5 MG PO TABS: 5.0000 mg | ORAL_TABLET | Freq: Three times a day (TID) | ORAL | 2 refills | Status: DC | PRN

## 2021-06-29 NOTE — Progress Notes (Signed)
? ?  Subjective:  ?Kathy Browning is a 31 y.o. G3P1011 at [redacted]w[redacted]d being seen today for ongoing prenatal care.  She is currently monitored for the following issues for this high-risk pregnancy and has Low grade squamous intraepith lesion on cytologic smear cervix (lgsil); Supervision of other normal pregnancy, antepartum; and Abnormal fetal ultrasound on their problem list. ? ?Patient reports no complaints.  Contractions: Not present. Vag. Bleeding: None.  Movement: Present. Denies leaking of fluid.  ? ?The following portions of the patient's history were reviewed and updated as appropriate: allergies, current medications, past family history, past medical history, past social history, past surgical history and problem list. Problem list updated. ? ?Objective:  ? ?Vitals:  ? 06/29/21 1531  ?BP: 123/69  ?Pulse: 84  ?Weight: 156 lb 1.6 oz (70.8 kg)  ? ? ?Fetal Status: Fetal Heart Rate (bpm): 154   Movement: Present    ? ?General:  Alert, oriented and cooperative. Patient is in no acute distress.  ?Skin: Skin is warm and dry. No rash noted.   ?Cardiovascular: Normal heart rate noted  ?Respiratory: Normal respiratory effort, no problems with respiration noted  ?Abdomen: Soft, gravid, appropriate for gestational age. Pain/Pressure: Present     ?Pelvic: Vag. Bleeding: None     ?Cervical exam deferred        ?Extremities: Normal range of motion.  Edema: None  ?Mental Status: Normal mood and affect. Normal behavior. Normal judgment and thought content.  ? ?Urinalysis:     ? ?Assessment and Plan:  ?Pregnancy: G3P1011 at [redacted]w[redacted]d ? ?1. Supervision of other normal pregnancy, antepartum ?BP and FHR normal ?No AFP collected last visit, reordered ?Having lots of headaches, trial flexeril ?Also having gum pain, going to see dentist soon ?Also having lots of congestion, trial flonase and claritin ? ?2. Abnormal fetal ultrasound ?Absent nasal bone, otherwise normal and low risk NIPS ?Declined amnio ?At patient's request again reviewed  information given at MFM visit, all questions answered ? ?3. Low grade squamous intraepith lesion on cytologic smear cervix (lgsil) ?In 06/2018, subsequent colpo unremarkable ?Needs pap PP ? ?Preterm labor symptoms and general obstetric precautions including but not limited to vaginal bleeding, contractions, leaking of fluid and fetal movement were reviewed in detail with the patient. ?Please refer to After Visit Summary for other counseling recommendations.  ?Return in 4 weeks (on 07/27/2021). ? ? ?Venora Maples, MD ? ?

## 2021-06-29 NOTE — Patient Instructions (Signed)

## 2021-06-29 NOTE — Progress Notes (Signed)
C/o tiredness, gum pain, headaches, and congestion ? ? ?

## 2021-07-01 LAB — AFP, SERUM, OPEN SPINA BIFIDA
AFP MoM: 1.21
AFP Value: 71.7 ng/mL
Gest. Age on Collection Date: 19.6 weeks
Maternal Age At EDD: 29.9 yr
OSBR Risk 1 IN: 10000
Test Results:: NEGATIVE
Weight: 156 [lb_av]

## 2021-07-07 ENCOUNTER — Encounter: Payer: Medicaid Other | Admitting: Obstetrics and Gynecology

## 2021-07-20 ENCOUNTER — Ambulatory Visit: Payer: No Typology Code available for payment source | Attending: Maternal & Fetal Medicine

## 2021-07-20 ENCOUNTER — Ambulatory Visit: Payer: No Typology Code available for payment source | Admitting: *Deleted

## 2021-07-20 VITALS — BP 118/68 | HR 83

## 2021-07-20 DIAGNOSIS — O402XX0 Polyhydramnios, second trimester, not applicable or unspecified: Secondary | ICD-10-CM

## 2021-07-20 DIAGNOSIS — Z369 Encounter for antenatal screening, unspecified: Secondary | ICD-10-CM | POA: Insufficient documentation

## 2021-07-20 DIAGNOSIS — O283 Abnormal ultrasonic finding on antenatal screening of mother: Secondary | ICD-10-CM | POA: Insufficient documentation

## 2021-07-20 DIAGNOSIS — Q758 Other specified congenital malformations of skull and face bones: Secondary | ICD-10-CM | POA: Insufficient documentation

## 2021-07-20 DIAGNOSIS — Z3A22 22 weeks gestation of pregnancy: Secondary | ICD-10-CM

## 2021-07-20 DIAGNOSIS — O35AXX0 Maternal care for other (suspected) fetal abnormality and damage, fetal facial anomalies, not applicable or unspecified: Secondary | ICD-10-CM

## 2021-07-20 DIAGNOSIS — O358XX0 Maternal care for other (suspected) fetal abnormality and damage, not applicable or unspecified: Secondary | ICD-10-CM | POA: Insufficient documentation

## 2021-07-20 DIAGNOSIS — Z3480 Encounter for supervision of other normal pregnancy, unspecified trimester: Secondary | ICD-10-CM

## 2021-07-21 ENCOUNTER — Other Ambulatory Visit: Payer: Self-pay | Admitting: *Deleted

## 2021-07-21 DIAGNOSIS — O409XX0 Polyhydramnios, unspecified trimester, not applicable or unspecified: Secondary | ICD-10-CM

## 2021-07-22 ENCOUNTER — Encounter: Payer: Self-pay | Admitting: Radiology

## 2021-07-27 ENCOUNTER — Ambulatory Visit (INDEPENDENT_AMBULATORY_CARE_PROVIDER_SITE_OTHER): Payer: No Typology Code available for payment source | Admitting: Family Medicine

## 2021-07-27 VITALS — BP 121/67 | HR 84 | Wt 170.0 lb

## 2021-07-27 DIAGNOSIS — O402XX0 Polyhydramnios, second trimester, not applicable or unspecified: Secondary | ICD-10-CM

## 2021-07-27 DIAGNOSIS — R87612 Low grade squamous intraepithelial lesion on cytologic smear of cervix (LGSIL): Secondary | ICD-10-CM

## 2021-07-27 DIAGNOSIS — Z3A23 23 weeks gestation of pregnancy: Secondary | ICD-10-CM

## 2021-07-27 DIAGNOSIS — Z3480 Encounter for supervision of other normal pregnancy, unspecified trimester: Secondary | ICD-10-CM

## 2021-07-27 NOTE — Progress Notes (Signed)
? ?  PRENATAL VISIT NOTE ? ?Subjective:  ?Kathy Browning is a 31 y.o. G3P1011 at [redacted]w[redacted]d being seen today for ongoing prenatal care.  She is currently monitored for the following issues for this low-risk pregnancy and has Low grade squamous intraepith lesion on cytologic smear cervix (lgsil); Supervision of other normal pregnancy, antepartum; and Abnormal fetal ultrasound on their problem list. ? ?Patient reports fatigue.  Contractions: Not present. Vag. Bleeding: None.  Movement: Present. Denies leaking of fluid.  ? ?The following portions of the patient's history were reviewed and updated as appropriate: allergies, current medications, past family history, past medical history, past social history, past surgical history and problem list.  ? ?Objective:  ? ?Vitals:  ? 07/27/21 1548  ?BP: 121/67  ?Pulse: 84  ?Weight: 170 lb (77.1 kg)  ? ? ?Fetal Status: Fetal Heart Rate (bpm): 150   Movement: Present    ? ?General:  Alert, oriented and cooperative. Patient is in no acute distress.  ?Skin: Skin is warm and dry. No rash noted.   ?Cardiovascular: Normal heart rate noted  ?Respiratory: Normal respiratory effort, no problems with respiration noted  ?Abdomen: Soft, gravid, appropriate for gestational age.  Pain/Pressure: Present     ?Pelvic: Cervical exam deferred        ?Extremities: Normal range of motion.  Edema: Trace  ?Mental Status: Normal mood and affect. Normal behavior. Normal judgment and thought content.  ? ?Assessment and Plan:  ?Pregnancy: G3P1011 at [redacted]w[redacted]d ?1. Supervision of other normal pregnancy, antepartum ?FHT and FH normal ? ?2. Low grade squamous intraepith lesion on cytologic smear cervix (lgsil) ?PAP postpartum ? ?3. Polyhydramnios in second trimester, single or unspecified fetus ?2hr GTT next appt.  ?Will follow growth and fluid. ? ?Preterm labor symptoms and general obstetric precautions including but not limited to vaginal bleeding, contractions, leaking of fluid and fetal movement were reviewed in  detail with the patient. ?Please refer to After Visit Summary for other counseling recommendations.  ? ?Return in about 4 weeks (around 08/24/2021) for 2 hr GTT, LR OB f/u. ? ?Future Appointments  ?Date Time Provider Department Center  ?08/31/2021  3:30 PM [facility] NURSE [facility]  ?08/31/2021  3:45 PM [facility] US4 [facility]  ? ? ?Levie Heritage, DO ?

## 2021-08-03 ENCOUNTER — Encounter: Payer: Medicaid Other | Admitting: Certified Nurse Midwife

## 2021-08-08 ENCOUNTER — Telehealth: Payer: Self-pay | Admitting: General Practice

## 2021-08-08 NOTE — Telephone Encounter (Signed)
Called patient regarding mychart message. Patient states HR called her and is trying to take her out of work based off the restrictions we submitted. Patient is unsure about what she should do. Discussed with patient I recommended she call HR back and let them know those are our recommendations for anyone who is pregnant and we would not approve of someone being pulled from work just because of these restriction recommendations. Patient verbalized understanding & states she will call them back to see what they say.  Patient had no other questions. ?

## 2021-08-08 NOTE — Telephone Encounter (Signed)
Patient called and left message on nurse voicemail line stating she is calling us back regarding her work papers.  ? ?Called patient and she states HR told her that unless we removed the 20lb lift restrictions, they will not let her return to work. Per Dr Shawnie Pons can update work note to reflect no lift restrictions. Letter created and patient called & notified. Patient verbalized understanding. ?

## 2021-08-15 ENCOUNTER — Telehealth: Payer: Self-pay

## 2021-08-15 ENCOUNTER — Telehealth: Payer: Self-pay | Admitting: General Practice

## 2021-08-15 ENCOUNTER — Inpatient Hospital Stay (HOSPITAL_COMMUNITY)
Admission: AD | Admit: 2021-08-15 | Discharge: 2021-08-15 | Disposition: A | Discharge status: Home / Self Care | Payer: BC Managed Care – PPO | Attending: Obstetrics and Gynecology | Admitting: Obstetrics and Gynecology

## 2021-08-15 ENCOUNTER — Encounter (HOSPITAL_COMMUNITY): Payer: Self-pay | Admitting: Obstetrics and Gynecology

## 2021-08-15 DIAGNOSIS — O23592 Infection of other part of genital tract in pregnancy, second trimester: Secondary | ICD-10-CM | POA: Insufficient documentation

## 2021-08-15 DIAGNOSIS — Z3A26 26 weeks gestation of pregnancy: Secondary | ICD-10-CM | POA: Diagnosis not present

## 2021-08-15 DIAGNOSIS — B3731 Acute candidiasis of vulva and vagina: Secondary | ICD-10-CM | POA: Insufficient documentation

## 2021-08-15 DIAGNOSIS — Z0371 Encounter for suspected problem with amniotic cavity and membrane ruled out: Secondary | ICD-10-CM

## 2021-08-15 LAB — WET PREP, GENITAL
Clue Cells Wet Prep HPF POC: NONE SEEN
Sperm: NONE SEEN
Trich, Wet Prep: NONE SEEN
WBC, Wet Prep HPF POC: <10 (ref <10)

## 2021-08-15 LAB — AMNISURE RUPTURE OF MEMBRANE (ROM) NOT AT ARMC: Amnisure ROM: NEGATIVE

## 2021-08-15 LAB — POCT FERN TEST: POCT Fern Test: NEGATIVE

## 2021-08-15 MED ORDER — TERCONAZOLE 0.4 % VA CREA
1.0000 | TOPICAL_CREAM | Freq: Every day | VAGINAL | 0 refills | Status: DC
Start: 2021-08-15 — End: 2021-08-24

## 2021-08-15 MED ORDER — FLUCONAZOLE 150 MG PO TABS
150.0000 mg | ORAL_TABLET | Freq: Once | ORAL | Status: AC
Start: 2021-08-15 — End: 2021-08-15
  Administered 2021-08-15: 150 mg via ORAL
  Filled 2021-08-15: qty 1

## 2021-08-15 NOTE — Progress Notes (Signed)
Addressed via telephone

## 2021-08-15 NOTE — Telephone Encounter (Signed)
Patient called into MFM reporting several gushes of watery discharge this morning and was requesting a call back. Called patient to inquire about discharge. Patient states when she got out of bed this morning she had a big gush of clear watery discharge and had to change her clothes completely. Patient states she got dressed and went to work. While at work she was sitting down at her desk and when she stood up, she had another big gush of watery discharge but this time there was some white in it. Patient states she had to leave work to go home and change her clothes. She denies cramping or abdominal pressure and endorses good fetal movement. Advised patient to go to MAU for evaluation to rule out rupture. Patient verbalized understanding. Dr Adrian Blackwater called & notified. ?

## 2021-08-15 NOTE — MAU Provider Note (Addendum)
?History  ?  ? ?CSN: [id number] ? ?Arrival date and time: 08/15/21 1109 ? ? Event Date/Time  ? First Provider Initiated Contact with Patient 08/15/21 1211   ?  ? ?Chief Complaint  ?Patient presents with  ? Rupture of Membranes  ? ?31 year old G3P1001 presents with two instances of "leaking fluid." States she had "gush of fluid" this morning around 0630, that was clear and watery, and she had to change her clothes. Second instance was at work this morning, she describes it as clear, with some white color, and some thicker discharge. Endorses history of yeast infection during this pregnancy, treated with topical antifungals. Denies any other symptoms at this time.  ? ?Vaginal Discharge ?The patient's primary symptoms include vaginal discharge. The patient's pertinent negatives include no genital odor or vaginal bleeding. This is a new problem. The current episode started today. The problem occurs 2 to 4 times per day. The problem has been unchanged. The patient is experiencing no pain. She is pregnant. Pertinent negatives include no abdominal pain, back pain, dysuria, nausea or vomiting. The vaginal discharge was clear, watery and white. There has been no bleeding. She has not been passing clots. She has not been passing tissue. Nothing aggravates the symptoms. She has tried nothing for the symptoms.  ? ?OB History   ? ? Gravida  ?3  ? Para  ?1  ? Term  ?1  ? Preterm  ?   ? AB  ?1  ? Living  ?1  ?  ? ? SAB  ?0  ? IAB  ?1  ? Ectopic  ?   ? Multiple  ?   ? Live Births  ?1  ?   ?  ?  ? ? ?Past Medical History:  ?Diagnosis Date  ? Anemia   ? ? ?Past Surgical History:  ?Procedure Laterality Date  ? MOLE REMOVAL    ? TOE SURGERY    ? ? ?Family History  ?Problem Relation Age of Onset  ? Hypertension Mother   ? Hypertension Maternal Grandmother   ? ? ?Social History  ? ?Tobacco Use  ? Smoking status: Former  ?  Types: Cigarettes  ?  Quit date: 03/29/2021  ?  Years since quitting: 0.3  ?  Passive exposure: Never  ? Smokeless  tobacco: Never  ?Vaping Use  ? Vaping Use: Never used  ?Substance Use Topics  ? Alcohol use: Not Currently  ?  Comment: not while preg  ? Drug use: Never  ? ? ?Allergies: No Known Allergies ? ?Medications Prior to Admission  ?Medication Sig Dispense Refill Last Dose  ? cyclobenzaprine (FLEXERIL) 5 MG tablet Take 1 tablet (5 mg total) by mouth every 8 (eight) hours as needed (headaches). 30 tablet 2   ? fluticasone (FLONASE) 50 MCG/ACT nasal spray Place 1 spray into both nostrils daily. 15.2 mL 2   ? loratadine (CLARITIN) 10 MG tablet Take 1 tablet (10 mg total) by mouth daily. 30 tablet 5   ? Prenatal Vit-Fe Fumarate-FA (PREPLUS) 27-1 MG TABS Take 1 tablet by mouth daily. 30 tablet 13   ? sodium chloride (OCEAN) 0.65 % SOLN nasal spray Place 1 spray into both nostrils as needed for congestion. 60 mL 1   ? ? ?Review of Systems  ?Constitutional: Negative.   ?HENT: Negative.    ?Eyes: Negative.   ?Respiratory: Negative.    ?Cardiovascular:  Negative for chest pain and leg swelling.  ?Gastrointestinal:  Negative for abdominal pain, nausea and vomiting.  ?  Endocrine: Negative.   ?Genitourinary:  Positive for vaginal discharge. Negative for dysuria.  ?Musculoskeletal:  Negative for back pain.  ?Skin: Negative.   ?Allergic/Immunologic: Negative.   ?Neurological: Negative.   ?Hematological: Negative.   ?Psychiatric/Behavioral: Negative.    ?Physical Exam  ? ?Blood pressure 125/72, pulse 96, temperature 99 ?F (37.2 ?C), temperature source Oral, resp. rate 18, height 5\' 3"  (1.6 m), weight 79.6 kg, last menstrual period 02/10/2021, SpO2 100 %. ? ?Physical Exam ?Constitutional:   ?   General: She is not in acute distress. ?   Appearance: Normal appearance.  ?HENT:  ?   Head: Normocephalic and atraumatic.  ?   Right Ear: External ear normal.  ?   Left Ear: External ear normal.  ?   Nose: Nose normal.  ?Eyes:  ?   Extraocular Movements: Extraocular movements intact.  ?   Conjunctiva/sclera: Conjunctivae normal.  ?   Pupils: Pupils  are equal, round, and reactive to light.  ?Cardiovascular:  ?   Rate and Rhythm: Normal rate and regular rhythm.  ?   Pulses: Normal pulses.  ?   Heart sounds: Normal heart sounds. No murmur heard. ?  No friction rub. No gallop.  ?Pulmonary:  ?   Effort: Pulmonary effort is normal.  ?   Breath sounds: Normal breath sounds. No wheezing, rhonchi or rales.  ?Musculoskeletal:     ?   General: Normal range of motion.  ?   Cervical back: Normal range of motion.  ?Skin: ?   General: Skin is warm and dry.  ?Neurological:  ?   Mental Status: She is alert and oriented to person, place, and time.  ?Psychiatric:     ?   Mood and Affect: Mood normal.     ?   Behavior: Behavior normal.     ?   Thought Content: Thought content normal.     ?   Judgment: Judgment normal.  ? ? ?MAU Course  ?Procedures ? ?Pelvic exam: normal external genitalia, vulva, vagina, cervix, uterus and adnexa, VAGINA: normal appearing vagina with normal color and discharge, no lesions, vaginal discharge - white, copious, curd-like, and thick, CERVIX: normal appearing cervix without discharge or lesions, WET MOUNT done - results: KOH done, hyphae. ? ?MDM ?Wet Prep ?C/G testing  ?AmniSure ? ?Assessment and Plan  ? ?Amnisure was negative, PPROM ruled out.  ? ?Positive for vaginal yeast infection; treat with oral Terazol intravaginal ointment x7 days and diflucan 150 mg PO x2. ? ?[redacted] weeks gestation. Follow up as planned on 08/24/2021 for 2hr GTT, and OB f/u. ? ?Return to MAU if symptoms do not improve or worsen.  ? ?Kathy Browning ?08/15/2021, 12:16 PM  ? ? ?PA student attestation: ? ?I have seen and examined this patient and agree with above documentation in the PA student's note.  ? ?Kathy Browning is a 31 y.o. 43P1011 female reporting 31 year old G3P1001 presents with two instances of "leaking fluid." States she had "gush of fluid" this morning around 0630, that was clear and watery, and she had to change her clothes. Second instance was at work this morning, she  describes it as clear, with some white color, and some thicker discharge. Endorses history of yeast infection during this pregnancy, treated with topical antifungals. Denies any other symptoms at this time.  ? ?Vaginal Discharge ?The patient's primary symptoms include vaginal discharge. The patient's pertinent negatives include no genital odor or vaginal bleeding. This is a new problem. The current episode started today. The  problem occurs 2 to 4 times per day. The problem has been unchanged. The patient is experiencing no pain. She is pregnant. Pertinent negatives include no abdominal pain, back pain, dysuria, nausea or vomiting. The vaginal discharge was clear, watery and white. There has been no bleeding. She has not been passing clots. She has not been passing tissue. Nothing aggravates the symptoms. She has tried nothing for the symptoms.  ? ?Associated symptoms:  ?Negative fever and chills ?Negative abdominal pain ?Negative vaginal bleeding ?+ vaginal discharge ?Negative urinary complaints ?Negative GI complaints ? ?PE: ?Patient Vitals for the past 24 hrs: ? BP Temp Temp src Pulse Resp SpO2 Height Weight  ?08/15/21 1355 125/74 -- -- 86 -- -- -- --  ?08/15/21 1142 125/72 99 ?F (37.2 ?C) Oral 96 18 100 % 5\' 3"  (1.6 m) 79.6 kg  ? ?Gen: calm comfortable, NAD ?Resp: normal effort, no distress ?Heart: Regular rate ?Abd: Soft, NT, Pos BS x 4 ?Neuro: A&O x 4 ?Pelvic exam: NEFG, Physiologic/moderate amount of thick, white, curd-like, discharge,  Cervical exam deferred  ?ROS, labs, PMH reviewed ? ?Orders Placed This Encounter  ?Procedures  ? Wet prep, genital  ? Amnisure rupture of membrane (rom)not at [facility]  ? Fern Test  ? Discharge patient  ? ?Meds ordered this encounter  ?Medications  ? fluconazole (DIFLUCAN) tablet 150 mg  ? terconazole (TERAZOL 7) 0.4 % vaginal cream  ?  Sig: Place 1 applicator vaginally at bedtime.  ?  Dispense:  45 g  ?  Refill:  0  ?  Order Specific Question:   Supervising Provider  ?  [facility] [place]  ? ? ?MDM ? ?MSE done. ?Diflucan given x 1 ?Wet prep and GC collected  ? ?Assessment ?1. Encounter for suspected PROM, with rupture of membranes not found   ?2. Vaginal yeast inf

## 2021-08-15 NOTE — MAU Note (Signed)
.  Kathy Browning is a 31 y.o. at [redacted]w[redacted]d here in MAU reporting: clear, watery discharge at 0630 and a second gush at 1100. She reports seeing white discharge with the second gush. 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 Also reports edema of the legs and feet for the past month. Has had history of HA's but denies HA's or visual changes at this time. Reports good FM. ? ?Onset of complaint: 0630 ?Pain score: 5/10 ?Vitals:  ? 08/15/21 1142  ?BP: 125/72  ?Pulse: 96  ?Resp: 18  ?Temp: 99 ?F (37.2 ?C)  ?SpO2: 100%  ?   ?FHT:140 ? ?

## 2021-08-16 LAB — GC/CHLAMYDIA PROBE AMP (~~LOC~~) NOT AT ARMC
Chlamydia: NEGATIVE
Comment: NEGATIVE
Comment: NORMAL
Neisseria Gonorrhea: NEGATIVE

## 2021-08-24 ENCOUNTER — Other Ambulatory Visit: Payer: Medicaid Other

## 2021-08-24 ENCOUNTER — Ambulatory Visit (INDEPENDENT_AMBULATORY_CARE_PROVIDER_SITE_OTHER): Payer: Medicaid Other | Admitting: Obstetrics & Gynecology

## 2021-08-24 VITALS — BP 136/73 | HR 90 | Wt 175.9 lb

## 2021-08-24 DIAGNOSIS — O283 Abnormal ultrasonic finding on antenatal screening of mother: Secondary | ICD-10-CM

## 2021-08-24 DIAGNOSIS — O24419 Gestational diabetes mellitus in pregnancy, unspecified control: Secondary | ICD-10-CM

## 2021-08-24 DIAGNOSIS — Z3480 Encounter for supervision of other normal pregnancy, unspecified trimester: Secondary | ICD-10-CM

## 2021-08-24 MED ORDER — PANTOPRAZOLE SODIUM 20 MG PO TBEC: 20.0000 mg | DELAYED_RELEASE_TABLET | Freq: Every day | ORAL | 3 refills | Status: DC

## 2021-08-24 NOTE — Progress Notes (Signed)
? ?  PRENATAL VISIT NOTE ? ?Subjective:  ?Kathy Browning is a 31 y.o. G3P1011 at [redacted]w[redacted]d being seen today for ongoing prenatal care.  She is currently monitored for the following issues for this low-risk pregnancy and has Low grade squamous intraepith lesion on cytologic smear cervix (lgsil); Supervision of other normal pregnancy, antepartum; and Abnormal fetal ultrasound on their problem list. ? ?Patient reports no complaints.  Contractions: Not present. Vag. Bleeding: None.  Movement: Present. Denies leaking of fluid.  ? ?The following portions of the patient's history were reviewed and updated as appropriate: allergies, current medications, past family history, past medical history, past social history, past surgical history and problem list.  ? ?Objective:  ? ?Vitals:  ? 08/24/21 0920  ?BP: 136/73  ?Pulse: 90  ?Weight: 175 lb 14.4 oz (79.8 kg)  ? ? ?Fetal Status: Fetal Heart Rate (bpm): 140   Movement: Present    ? ?General:  Alert, oriented and cooperative. Patient is in no acute distress.  ?Skin: Skin is warm and dry. No rash noted.   ?Cardiovascular: Normal heart rate noted  ?Respiratory: Normal respiratory effort, no problems with respiration noted  ?Abdomen: Soft, gravid, appropriate for gestational age.  Pain/Pressure: Present     ?Pelvic: Cervical exam deferred        ?Extremities: Normal range of motion.     ?Mental Status: Normal mood and affect. Normal behavior. Normal judgment and thought content.  ? ?Assessment and Plan:  ?Pregnancy: G3P1011 at [redacted]w[redacted]d ?1. Supervision of other normal pregnancy, antepartum ?28 week labs. Reports reflux ?- Tdap vaccine greater than or equal to 7yo IM ?- pantoprazole (PROTONIX) 20 MG tablet; Take 1 tablet (20 mg total) by mouth daily.  Dispense: 30 tablet; Refill: 3 ? ?2. Abnormal fetal ultrasound ?F/u next week ? ?Preterm labor symptoms and general obstetric precautions including but not limited to vaginal bleeding, contractions, leaking of fluid and fetal movement were  reviewed in detail with the patient. ?Please refer to After Visit Summary for other counseling recommendations.  ? ?Return in about 2 weeks (around 09/07/2021). ? ?Future Appointments  ?Date Time Provider [place]  ?08/24/2021  9:55 AM Woodroe Mode, MD [facility]  ?08/31/2021  3:30 PM [facility] NURSE [facility]  ?08/31/2021  3:45 PM [facility] US4 [facility]  ? ? ?Emeterio Reeve, MD ?

## 2021-08-25 LAB — CBC
Hematocrit: 32.5 % — ABNORMAL LOW (ref 34.0–46.6)
Hemoglobin: 10.9 g/dL — ABNORMAL LOW (ref 11.1–15.9)
MCH: 31.9 pg (ref 26.6–33.0)
MCHC: 33.5 g/dL (ref 31.5–35.7)
MCV: 95 fL (ref 79–97)
Platelets: 278 10*3/uL (ref 150–450)
RBC: 3.42 x10E6/uL — ABNORMAL LOW (ref 3.77–5.28)
RDW: 12.7 % (ref 11.7–15.4)
WBC: 9.7 10*3/uL (ref 3.4–10.8)

## 2021-08-25 LAB — HIV ANTIBODY (ROUTINE TESTING W REFLEX): HIV Screen 4th Generation wRfx: NONREACTIVE

## 2021-08-25 LAB — GLUCOSE TOLERANCE, 2 HOURS W/ 1HR
Glucose, 1 hour: 184 mg/dL — ABNORMAL HIGH (ref 70–179)
Glucose, 2 hour: 132 mg/dL (ref 70–152)
Glucose, Fasting: 80 mg/dL (ref 70–91)

## 2021-08-25 LAB — RPR: RPR Ser Ql: NONREACTIVE

## 2021-08-27 DIAGNOSIS — O24419 Gestational diabetes mellitus in pregnancy, unspecified control: Secondary | ICD-10-CM | POA: Insufficient documentation

## 2021-08-29 ENCOUNTER — Telehealth: Payer: Self-pay

## 2021-08-29 ENCOUNTER — Other Ambulatory Visit: Payer: Medicaid Other

## 2021-08-29 MED ORDER — GLUCOSE BLOOD VI STRP: ORAL_STRIP | 12 refills | Status: DC

## 2021-08-29 MED ORDER — ACCU-CHEK SOFTCLIX LANCETS MISC: 12 refills | Status: DC

## 2021-08-29 MED ORDER — ACCU-CHEK GUIDE W/DEVICE KIT
1.0000 | PACK | Freq: Four times a day (QID) | 0 refills | Status: DC
Start: 2021-08-29 — End: 2021-11-13

## 2021-08-29 NOTE — Telephone Encounter (Signed)
-----   Message from Adam Phenix, MD sent at 08/27/2021  4:45 PM EDT ----- ?Patient has GDM and will need appointment with diabetes educator ?

## 2021-08-29 NOTE — Telephone Encounter (Signed)
Call placed to pt. Spoke with pt. Pt given results and recommendations per Dr Debroah Loop. Pt verbalized understanding and agreeable to plan of care. Diabetic supplies sent to pharmacy. Pt aware to pick up and bring to appt with Marylene Land. Diabetes ED appt with Marylene Land on 5/18 at 1315. Pt agreeable to date and time of appt.  ? Judeth Cornfield, RN  ?

## 2021-08-31 ENCOUNTER — Ambulatory Visit: Payer: BC Managed Care – PPO | Attending: Obstetrics | Admitting: Obstetrics

## 2021-08-31 ENCOUNTER — Ambulatory Visit: Payer: BC Managed Care – PPO | Attending: Obstetrics and Gynecology

## 2021-08-31 ENCOUNTER — Ambulatory Visit: Payer: BC Managed Care – PPO | Admitting: *Deleted

## 2021-08-31 VITALS — BP 126/68 | HR 83

## 2021-08-31 DIAGNOSIS — Z3A28 28 weeks gestation of pregnancy: Secondary | ICD-10-CM | POA: Diagnosis not present

## 2021-08-31 DIAGNOSIS — O24410 Gestational diabetes mellitus in pregnancy, diet controlled: Secondary | ICD-10-CM | POA: Diagnosis not present

## 2021-08-31 DIAGNOSIS — O358XX0 Maternal care for other (suspected) fetal abnormality and damage, not applicable or unspecified: Secondary | ICD-10-CM | POA: Insufficient documentation

## 2021-08-31 DIAGNOSIS — Z362 Encounter for other antenatal screening follow-up: Secondary | ICD-10-CM | POA: Insufficient documentation

## 2021-08-31 DIAGNOSIS — O403XX0 Polyhydramnios, third trimester, not applicable or unspecified: Secondary | ICD-10-CM

## 2021-08-31 DIAGNOSIS — Z3A29 29 weeks gestation of pregnancy: Secondary | ICD-10-CM | POA: Diagnosis not present

## 2021-08-31 DIAGNOSIS — Z3480 Encounter for supervision of other normal pregnancy, unspecified trimester: Secondary | ICD-10-CM | POA: Insufficient documentation

## 2021-08-31 DIAGNOSIS — O409XX0 Polyhydramnios, unspecified trimester, not applicable or unspecified: Secondary | ICD-10-CM | POA: Insufficient documentation

## 2021-08-31 DIAGNOSIS — O283 Abnormal ultrasonic finding on antenatal screening of mother: Secondary | ICD-10-CM

## 2021-09-01 ENCOUNTER — Other Ambulatory Visit: Payer: Self-pay | Admitting: *Deleted

## 2021-09-01 ENCOUNTER — Encounter: Payer: Self-pay | Admitting: Certified Nurse Midwife

## 2021-09-01 DIAGNOSIS — Q758 Other specified congenital malformations of skull and face bones: Secondary | ICD-10-CM

## 2021-09-01 DIAGNOSIS — O24419 Gestational diabetes mellitus in pregnancy, unspecified control: Secondary | ICD-10-CM

## 2021-09-01 DIAGNOSIS — O409XX0 Polyhydramnios, unspecified trimester, not applicable or unspecified: Secondary | ICD-10-CM

## 2021-09-01 NOTE — Progress Notes (Signed)
MFM Note ? ?Kathy Browning was seen for a follow up exam due to recently diagnosed gestational diabetes and polyhydramnios noted on her prior exams.  She is scheduled for diabetic teaching next week. ? ?She was informed that the fetal growth (EFW 2 lbs 12 oz, 27th%) appears appropriate for her gestational age. Mild polyhydramnios with a total AFI of 25.6 cm continues to be noted.  ? ?The following were discussed during our consultation today: ? ?Gestational diabetes and pregnancy ? ?The implications and management of diabetes in pregnancy was discussed in detail with the patient.   ? ?She was advised to monitor her fingersticks 4 times daily (fasting and 2 hours after each meal).   ? ?She was advised that our goals for her fingerstick values are fasting values of 90-95 or less and two-hour postprandial values of 120 or less.   ? ?Should the majority of her fingerstick results be above these values, she may have to be started on metformin or insulin to help her achieve better glycemic control.  ? ?The patient was advised that getting her fingerstick values as close to these goals as possible would provide her with the most optimal obstetrical outcome. ? ?We will continue to follow her with monthly growth ultrasounds. ? ?Weekly fetal testing starting at 32 weeks is recommended should she require insulin or metformin for treatment. ? ?The increased risk of polyhydramnios, fetal macrosomia, and preeclampsia associated with diabetes was also discussed.   ? ?The patient was advised that delivery for well-controlled diabetes in pregnancy is usually recommended at around 39 weeks.  Delivery at 37 weeks may be considered should her glycemic control be poor. ? ?The patient stated that all of her questions have been answered to her satisfaction.   ? ?A follow-up growth scan was scheduled in 4 weeks. ? ?A total of 20 minutes was spent counseling and coordinating the care for this patient.  Greater than 50% of the time was spent  in direct face-to-face contact.   ?

## 2021-09-08 ENCOUNTER — Ambulatory Visit (INDEPENDENT_AMBULATORY_CARE_PROVIDER_SITE_OTHER): Payer: Medicaid Other | Admitting: Obstetrics and Gynecology

## 2021-09-08 ENCOUNTER — Other Ambulatory Visit: Payer: Self-pay

## 2021-09-08 ENCOUNTER — Ambulatory Visit (INDEPENDENT_AMBULATORY_CARE_PROVIDER_SITE_OTHER): Payer: Medicaid Other | Admitting: Registered"

## 2021-09-08 ENCOUNTER — Encounter: Payer: BC Managed Care – PPO | Attending: Obstetrics and Gynecology | Admitting: Registered"

## 2021-09-08 ENCOUNTER — Encounter: Payer: Self-pay | Admitting: Obstetrics and Gynecology

## 2021-09-08 VITALS — BP 125/78 | HR 87 | Wt 178.1 lb

## 2021-09-08 DIAGNOSIS — Z3A00 Weeks of gestation of pregnancy not specified: Secondary | ICD-10-CM | POA: Diagnosis not present

## 2021-09-08 DIAGNOSIS — Z713 Dietary counseling and surveillance: Secondary | ICD-10-CM | POA: Diagnosis not present

## 2021-09-08 DIAGNOSIS — O24410 Gestational diabetes mellitus in pregnancy, diet controlled: Secondary | ICD-10-CM | POA: Diagnosis not present

## 2021-09-08 DIAGNOSIS — O403XX0 Polyhydramnios, third trimester, not applicable or unspecified: Secondary | ICD-10-CM

## 2021-09-08 DIAGNOSIS — O24419 Gestational diabetes mellitus in pregnancy, unspecified control: Secondary | ICD-10-CM

## 2021-09-08 DIAGNOSIS — Z3A30 30 weeks gestation of pregnancy: Secondary | ICD-10-CM

## 2021-09-08 DIAGNOSIS — R87612 Low grade squamous intraepithelial lesion on cytologic smear of cervix (LGSIL): Secondary | ICD-10-CM

## 2021-09-08 NOTE — Progress Notes (Signed)
   PRENATAL VISIT NOTE  Subjective:  Kathy Browning is a 31 y.o. G3P1011 at [redacted]w[redacted]d being seen today for ongoing prenatal care.  She is currently monitored for the following issues for this high-risk pregnancy and has Low grade squamous intraepith lesion on cytologic smear cervix (lgsil); Supervision of other normal pregnancy, antepartum; Abnormal fetal ultrasound; Gestational diabetes; and Polyhydramnios affecting pregnancy in third trimester on their problem list.  Patient reports no complaints.  Contractions: Not present. Vag. Bleeding: None.  Movement: Present. Denies leaking of fluid.   The following portions of the patient's history were reviewed and updated as appropriate: allergies, current medications, past family history, past medical history, past social history, past surgical history and problem list.   Objective:   Vitals:   09/08/21 1422  BP: 125/78  Pulse: 87  Weight: 178 lb 1.6 oz (80.8 kg)    Fetal Status: Fetal Heart Rate (bpm): 154   Movement: Present     General:  Alert, oriented and cooperative. Patient is in no acute distress.  Skin: Skin is warm and dry. No rash noted.   Cardiovascular: Normal heart rate noted  Respiratory: Normal respiratory effort, no problems with respiration noted  Abdomen: Soft, gravid, appropriate for gestational age.  Pain/Pressure: Present     Pelvic: Cervical exam deferred        Extremities: Normal range of motion.  Edema: None  Mental Status: Normal mood and affect. Normal behavior. Normal judgment and thought content.   Assessment and Plan:  Pregnancy: G3P1011 at [redacted]w[redacted]d 1. [redacted] weeks gestation of pregnancy Needs PP pap  2. Gestational diabetes mellitus (GDM) in third trimester, gestational diabetes method of control unspecified DM education visit today Will cancel 6/1 visit and do 7-10 cbg, ob visit  3. Polyhydramnios affecting pregnancy in third trimester 6/8 growth and bpp; 5/10 afi 25.6, efw 27%, ac 52%>>starting testing with  mfm  4. Low grade squamous intraepith lesion on cytologic smear cervix (lgsil) Needs pp pap  Preterm labor symptoms and general obstetric precautions including but not limited to vaginal bleeding, contractions, leaking of fluid and fetal movement were reviewed in detail with the patient. Please refer to After Visit Summary for other counseling recommendations.   Return in about 1 week (around 09/15/2021) for 7-10d md visit, high risk ob, in person or virtual.  Future Appointments  Date Time Provider [place]  09/22/2021  3:35 PM [facility]  09/29/2021  1:45 PM [facility] NURSE [facility]  09/29/2021  2:00 PM [facility] US1 [facility]  10/05/2021  3:55 PM Griffin Basil, MD [facility]  10/13/2021  3:55 PM Renard Matter, MD [facility]    Aletha Halim, MD

## 2021-09-08 NOTE — Progress Notes (Signed)
Patient was seen for Gestational Diabetes self-management on 09/08/21  Start time 1327 and End time 1416   Estimated due date: 11/17/21; [redacted]w[redacted]d  Clinical: Medications: reviewed Medical History: reviewed Labs: OGTT 84-184(H)-132, A1c 5.0% 05/04/21  Dietary and Lifestyle History: No prior GDM. Pt reports having issues with heartburn. Pt's S.O. states he is familiar with T2D because of mother. Participated in appointment, asked good questions.  Pt declined offer for follow-up visit. Did not have time for full education but pt states she will read handout and let me know if she has questions.   Physical Activity: 2-3x/week walking in the evening Stress: not assessed Sleep: not assessed  24 hr Recall: (did not get full recall) First Meal: Snack: Second meal: ribs, broccoli, cheese, baked beans, a little juicy juice Snack: Third meal: Snack: Beverages:  NUTRITION INTERVENTION  Nutrition education (E-1) on the following topics:   Initial Follow-up  [x]  []  Definition of Gestational Diabetes []  []  Why dietary management is important in controlling blood glucose [x]  []  Effects each nutrient has on blood glucose levels []  []  Simple carbohydrates vs complex carbohydrates []  []  Fluid intake [x]  []  Creating a balanced meal plan [x]  []  Carbohydrate counting  [x]  []  When to check blood glucose levels [x]  []  Proper blood glucose monitoring techniques [x]  []  Effect of stress and stress reduction techniques  [x]  []  Exercise effect on blood glucose levels, appropriate exercise during pregnancy []  []  Importance of limiting caffeine and abstaining from alcohol and smoking []  []  Medications used for blood sugar control during pregnancy []  []  Hypoglycemia and rule of 15 []  []  Postpartum self care  Patient already has a meter FBS: 70-90 mg/dL Postprandial: today was 99 mg/dL 2-hr PPBG pt states highest in last 2 weeks has been 160 mg/dL  Patient instructed to monitor glucose levels: FBS: 60  - ? 95 mg/dL (some clinics use 90 for cutoff) 1 hour: ? 140 mg/dL 2 hour: ? mg/dL  Patient received handouts: Nutrition Diabetes and Pregnancy Carbohydrate Counting List  Patient will be seen for follow-up as needed.

## 2021-09-13 ENCOUNTER — Encounter: Payer: Medicaid Other | Admitting: Family Medicine

## 2021-09-13 DIAGNOSIS — Z3A30 30 weeks gestation of pregnancy: Secondary | ICD-10-CM

## 2021-09-13 DIAGNOSIS — O24410 Gestational diabetes mellitus in pregnancy, diet controlled: Secondary | ICD-10-CM

## 2021-09-13 DIAGNOSIS — R87612 Low grade squamous intraepithelial lesion on cytologic smear of cervix (LGSIL): Secondary | ICD-10-CM

## 2021-09-13 DIAGNOSIS — O403XX0 Polyhydramnios, third trimester, not applicable or unspecified: Secondary | ICD-10-CM

## 2021-09-13 DIAGNOSIS — Z3480 Encounter for supervision of other normal pregnancy, unspecified trimester: Secondary | ICD-10-CM

## 2021-09-21 ENCOUNTER — Ambulatory Visit (INDEPENDENT_AMBULATORY_CARE_PROVIDER_SITE_OTHER): Payer: Medicaid Other | Admitting: Obstetrics & Gynecology

## 2021-09-21 VITALS — BP 131/80 | HR 109 | Wt 178.0 lb

## 2021-09-21 DIAGNOSIS — Z3480 Encounter for supervision of other normal pregnancy, unspecified trimester: Secondary | ICD-10-CM

## 2021-09-21 DIAGNOSIS — O24419 Gestational diabetes mellitus in pregnancy, unspecified control: Secondary | ICD-10-CM

## 2021-09-21 MED ORDER — METFORMIN HCL 500 MG PO TABS: 500.0000 mg | ORAL_TABLET | Freq: Every day | ORAL | 5 refills | Status: DC

## 2021-09-21 MED ORDER — FLUCONAZOLE 150 MG PO TABS: 150.0000 mg | ORAL_TABLET | Freq: Once | ORAL | 0 refills | Status: AC

## 2021-09-21 MED ORDER — METFORMIN HCL 500 MG PO TABS: 500.0000 mg | ORAL_TABLET | Freq: Two times a day (BID) | ORAL | 5 refills | Status: DC

## 2021-09-21 NOTE — Progress Notes (Signed)
   PRENATAL VISIT NOTE  Subjective:  Kathy Browning is a 31 y.o. G3P1011 at [redacted]w[redacted]d being seen today for ongoing prenatal care.  She is currently monitored for the following issues for this high-risk pregnancy and has Low grade squamous intraepith lesion on cytologic smear cervix (lgsil); Supervision of other normal pregnancy, antepartum; Abnormal fetal ultrasound; Gestational diabetes; and Polyhydramnios affecting pregnancy in third trimester on their problem list.  Patient reports headache.  Contractions: Not present. Vag. Bleeding: None.  Movement: Present. Denies leaking of fluid.   The following portions of the patient's history were reviewed and updated as appropriate: allergies, current medications, past family history, past medical history, past social history, past surgical history and problem list.   Objective:   Vitals:   09/21/21 1551  BP: 131/80  Pulse: (!) 109  Weight: 178 lb (80.7 kg)    Fetal Status: Fetal Heart Rate (bpm): 149   Movement: Present     General:  Alert, oriented and cooperative. Patient is in no acute distress.  Skin: Skin is warm and dry. No rash noted.   Cardiovascular: Normal heart rate noted  Respiratory: Normal respiratory effort, no problems with respiration noted  Abdomen: Soft, gravid, appropriate for gestational age.  Pain/Pressure: Present     Pelvic: Cervical exam deferred        Extremities: Normal range of motion.     Mental Status: Normal mood and affect. Normal behavior. Normal judgment and thought content.   Assessment and Plan:  Pregnancy: G3P1011 at [redacted]w[redacted]d 1. Gestational diabetes mellitus (GDM) in third trimester, gestational diabetes method of control unspecified FBS reported in range but PP up to 160-180 - metFORMIN (GLUCOPHAGE) 500 MG tablet; Take 1 tablet (500 mg total) by mouth daily with breakfast.  Dispense: 60 tablet; Refill: 5  2. Supervision of other normal pregnancy, antepartum Yeast sx - fluconazole (DIFLUCAN) 150 MG  tablet; Take 1 tablet (150 mg total) by mouth once for 1 dose.  Dispense: 1 tablet; Refill: 0  Preterm labor symptoms and general obstetric precautions including but not limited to vaginal bleeding, contractions, leaking of fluid and fetal movement were reviewed in detail with the patient. Please refer to After Visit Summary for other counseling recommendations.   Return in about 1 week (around 09/28/2021).  Future Appointments  Date Time Provider [place]  09/29/2021  1:45 PM [facility] NURSE [facility]  09/29/2021  2:00 PM [facility] US1 [facility]  10/05/2021  3:55 PM Griffin Basil, MD [facility]  10/13/2021  3:55 PM Renard Matter, MD [facility]    Emeterio Reeve, MD

## 2021-09-21 NOTE — Progress Notes (Signed)
Patient complains of "real bad headaches" and stated that she would try to sleep it off but when she would wake up the headaches would become worse.   She stated "my head feels like it is splitting. It's been like this since beginning of my pregnancy". Denies vision disturbances.

## 2021-09-22 ENCOUNTER — Telehealth: Payer: Medicaid Other | Admitting: Student

## 2021-09-29 ENCOUNTER — Ambulatory Visit: Payer: BC Managed Care – PPO | Admitting: *Deleted

## 2021-09-29 ENCOUNTER — Ambulatory Visit: Payer: BC Managed Care – PPO | Attending: Obstetrics

## 2021-09-29 VITALS — BP 112/64 | HR 94

## 2021-09-29 DIAGNOSIS — Z3480 Encounter for supervision of other normal pregnancy, unspecified trimester: Secondary | ICD-10-CM | POA: Insufficient documentation

## 2021-09-29 DIAGNOSIS — O358XX0 Maternal care for other (suspected) fetal abnormality and damage, not applicable or unspecified: Secondary | ICD-10-CM | POA: Diagnosis not present

## 2021-09-29 DIAGNOSIS — Z3A33 33 weeks gestation of pregnancy: Secondary | ICD-10-CM | POA: Diagnosis not present

## 2021-09-29 DIAGNOSIS — O283 Abnormal ultrasonic finding on antenatal screening of mother: Secondary | ICD-10-CM | POA: Insufficient documentation

## 2021-09-29 DIAGNOSIS — O409XX0 Polyhydramnios, unspecified trimester, not applicable or unspecified: Secondary | ICD-10-CM | POA: Insufficient documentation

## 2021-09-29 DIAGNOSIS — O35AXX0 Maternal care for other (suspected) fetal abnormality and damage, fetal facial anomalies, not applicable or unspecified: Secondary | ICD-10-CM | POA: Diagnosis not present

## 2021-09-29 DIAGNOSIS — Z362 Encounter for other antenatal screening follow-up: Secondary | ICD-10-CM | POA: Diagnosis present

## 2021-09-29 DIAGNOSIS — O24419 Gestational diabetes mellitus in pregnancy, unspecified control: Secondary | ICD-10-CM | POA: Insufficient documentation

## 2021-09-29 DIAGNOSIS — Q758 Other specified congenital malformations of skull and face bones: Secondary | ICD-10-CM | POA: Insufficient documentation

## 2021-09-29 DIAGNOSIS — O24415 Gestational diabetes mellitus in pregnancy, controlled by oral hypoglycemic drugs: Secondary | ICD-10-CM

## 2021-09-29 NOTE — Progress Notes (Signed)
Pt states she has not been feeling well the past 3 days; she has been very nauseated. She ate a waffle, took a nap and vomited upon waking. She says this happens often and gets a headache a couple times weekly.  She felt dizzy and just took her CBG and it was 74. She was offered some ginger ale.  Encouraged her to speak to her MD about these symptoms.

## 2021-09-30 ENCOUNTER — Other Ambulatory Visit: Payer: Self-pay | Admitting: *Deleted

## 2021-09-30 DIAGNOSIS — O24415 Gestational diabetes mellitus in pregnancy, controlled by oral hypoglycemic drugs: Secondary | ICD-10-CM

## 2021-09-30 DIAGNOSIS — O409XX0 Polyhydramnios, unspecified trimester, not applicable or unspecified: Secondary | ICD-10-CM

## 2021-09-30 DIAGNOSIS — Q758 Other specified congenital malformations of skull and face bones: Secondary | ICD-10-CM

## 2021-10-03 ENCOUNTER — Telehealth: Payer: Self-pay

## 2021-10-03 DIAGNOSIS — Z3480 Encounter for supervision of other normal pregnancy, unspecified trimester: Secondary | ICD-10-CM

## 2021-10-03 MED ORDER — BLOOD PRESSURE KIT DEVI: 1.0000 | Freq: Once | 0 refills | Status: DC

## 2021-10-03 MED ORDER — BLOOD PRESSURE KIT DEVI: 1.0000 | Freq: Once | 0 refills | Status: AC

## 2021-10-03 NOTE — Progress Notes (Signed)
Called pt in response to MyChart message. Pt reports multiple new symptoms following diagnosis of GDM. Reports increased pelvic pressure. Pt states she has maternity support belt, but does not wear frequently. Encouraged pt to use this while on feet at work. Reports nausea and diarrhea. Reports headaches. Difficulty completing full day of work due to these symptoms. Pt would like to stop work at 36 weeks. Encouraged pt to speak with work about specifics of maternity leave. Explained that there is no medical indication for stopping work prior to delivery, but that we will support the patient in what she chooses. Explained process of FMLA paperwork, pt to bring any needed paperwork to appt on Thursday. Reviewed that stomach upset is common side effect of Metformin. Pt states she is not checking BG regularly. Encouraged pt to check 4 times daily as previously instructed so that her provider can accurately control blood glucose, which will improve how she feels throughout the day. Pt does not have BP cuff at home. Encouraged pt to take Tylenol for headache. BP cuff sent to [facility]. Pt would like to pick this up. Pt to check BP at home once she picks this up. Will follow up with any worsening symptoms.

## 2021-10-05 ENCOUNTER — Ambulatory Visit: Payer: BC Managed Care – PPO | Admitting: *Deleted

## 2021-10-05 ENCOUNTER — Ambulatory Visit (INDEPENDENT_AMBULATORY_CARE_PROVIDER_SITE_OTHER): Payer: BC Managed Care – PPO

## 2021-10-05 ENCOUNTER — Ambulatory Visit (INDEPENDENT_AMBULATORY_CARE_PROVIDER_SITE_OTHER): Payer: BC Managed Care – PPO | Admitting: Obstetrics and Gynecology

## 2021-10-05 VITALS — BP 116/68 | HR 85 | Wt 179.7 lb

## 2021-10-05 DIAGNOSIS — O24415 Gestational diabetes mellitus in pregnancy, controlled by oral hypoglycemic drugs: Secondary | ICD-10-CM | POA: Diagnosis not present

## 2021-10-05 DIAGNOSIS — O283 Abnormal ultrasonic finding on antenatal screening of mother: Secondary | ICD-10-CM

## 2021-10-05 DIAGNOSIS — Z3A33 33 weeks gestation of pregnancy: Secondary | ICD-10-CM

## 2021-10-05 DIAGNOSIS — Z3480 Encounter for supervision of other normal pregnancy, unspecified trimester: Secondary | ICD-10-CM

## 2021-10-05 IMAGING — US US FETAL BPP W/ NON-STRESS
1 series · 13 of 14 positions shown · non-contrast
Comparison: none

[Series 1: us fetal bpp w/ non-stress · 14 acquisitions, 13 frames shown]
[im 1/14]
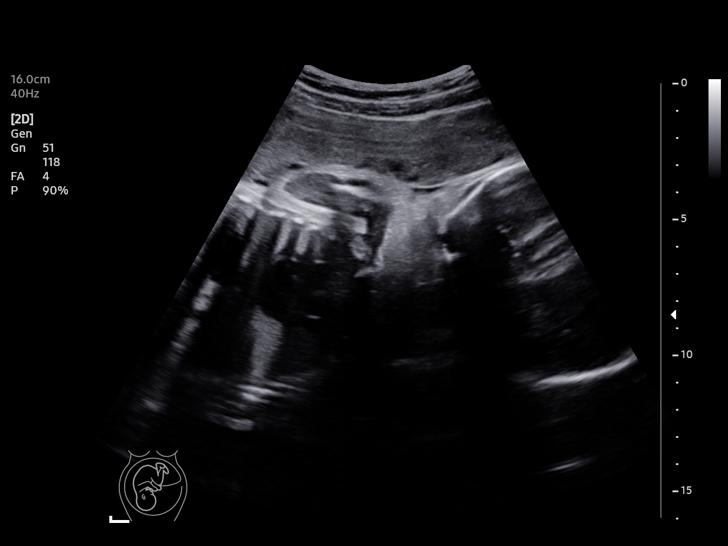
[im 2/14]
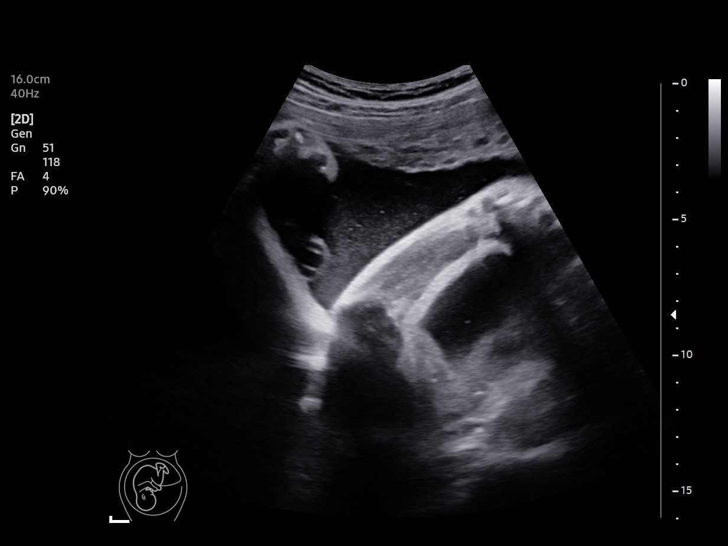
[im 3/14]
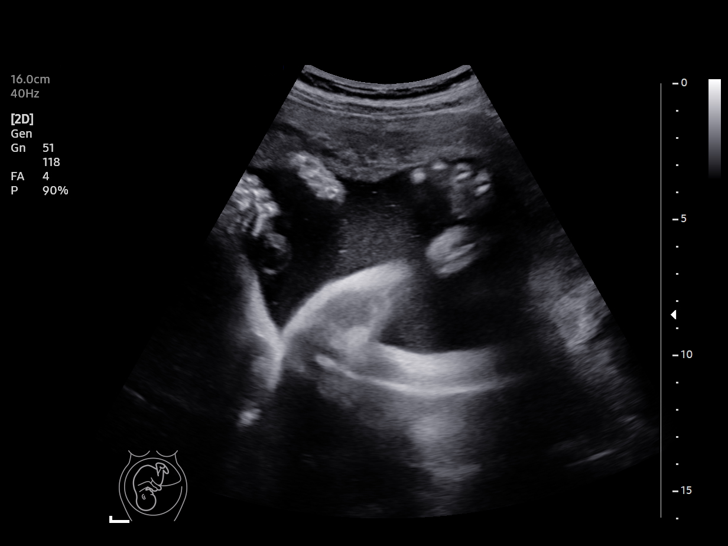
[im 4/14]
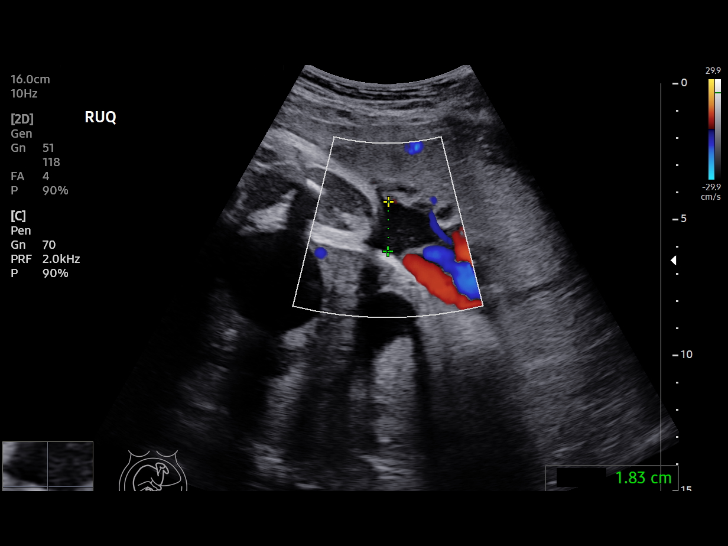
[im 5/14]
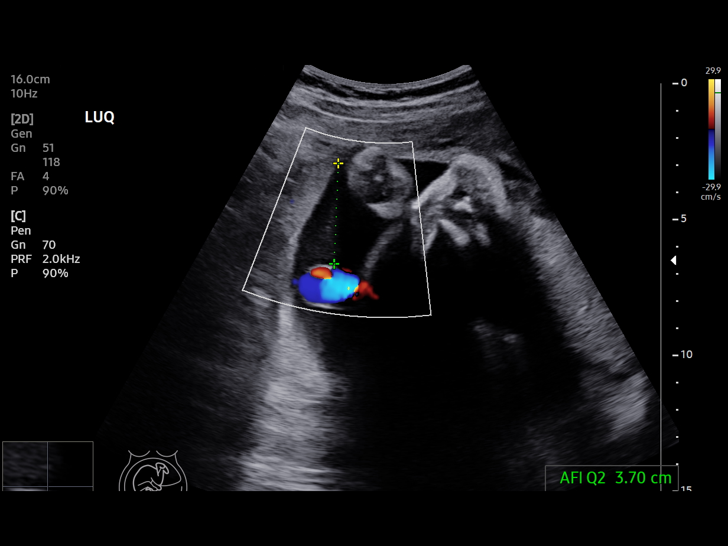
[im 6/14]
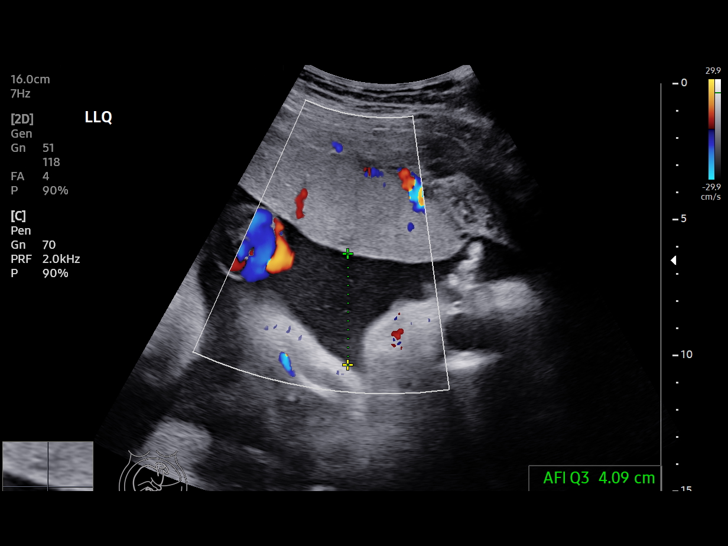
[im 8/14]
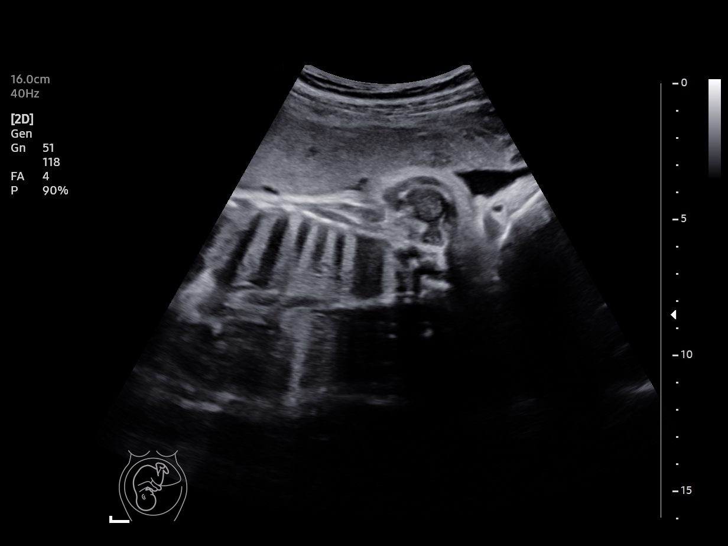
[im 9/14]
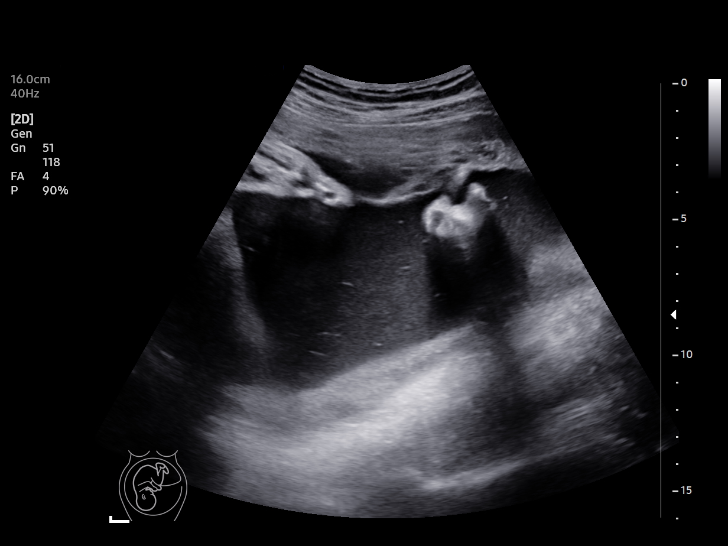
[im 10/14]
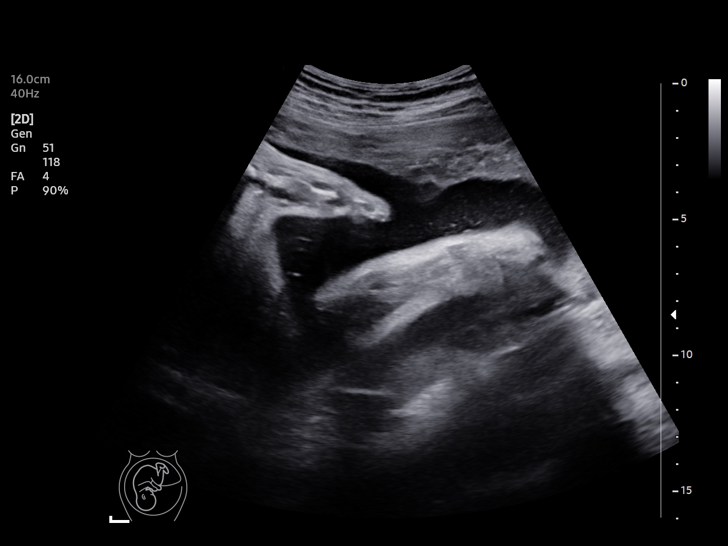
[im 11/14]
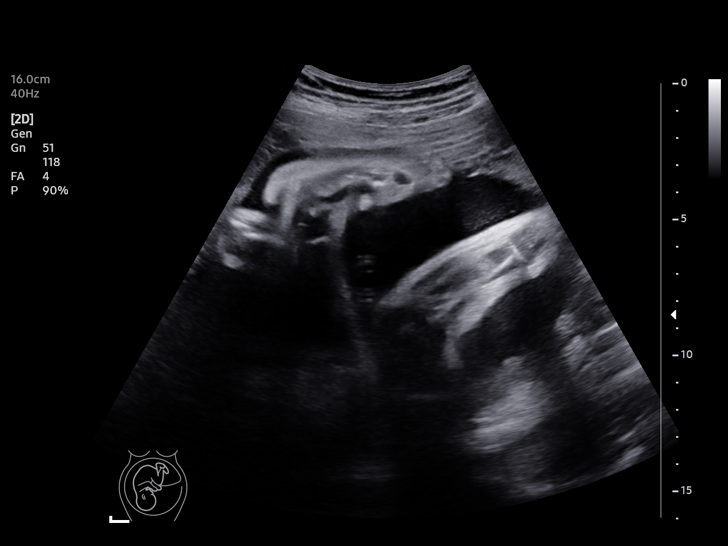
[im 12/14]
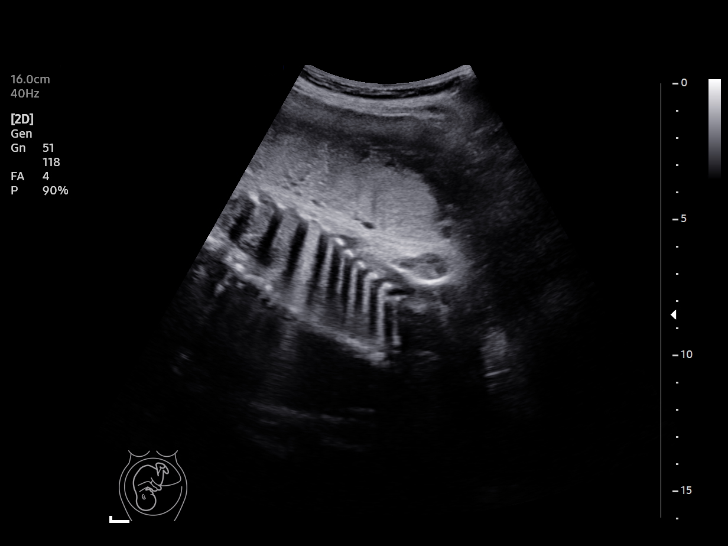
[im 13/14]
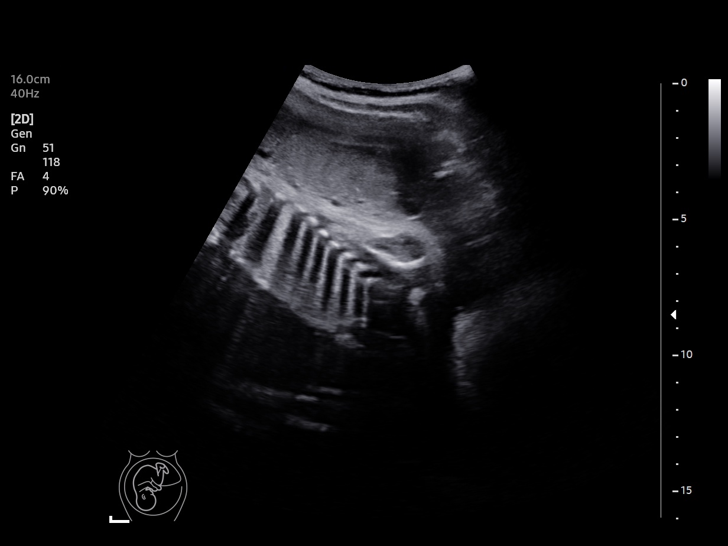
[im 14/14]
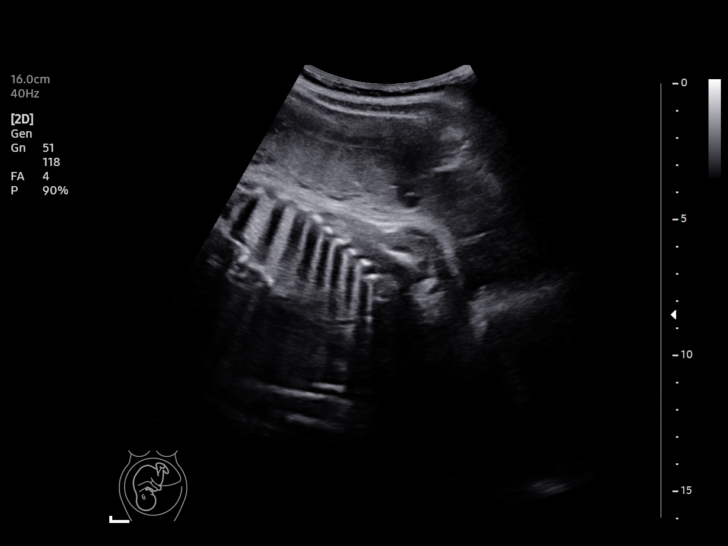

[13 of 14 positions shown; findings below may reference images not displayed]

Attending:        GORDEN       Secondary Phy.:    [REDACTED]
                                                             Healthcare at

Service(s) Provided

Indications

 33 weeks gestation of pregnancy
 Gestational diabetes in pregnancy,              [38]
 controlled by oral hypoglycemic drugs
Fetal Evaluation

 Num Of Fetuses:          1
 Preg. Location:          Intrauterine
 Cardiac Activity:        Observed
 Presentation:            Cephalic

 Amniotic Fluid
 AFI FV:      Within normal limits

 AFI Sum(cm)     %Tile       Largest Pocket(cm)
 13.73           46

 RUQ(cm)       RLQ(cm)       LUQ(cm)        LLQ(cm)


 Comment:    BPP [DATE]
Biophysical Evaluation
 Amniotic F.V:   Pocket => 2 cm             F. Tone:         Observed
 F. Movement:    Observed                   N.S.T:           Reactive
 F. Breathing:   Observed                   Score:           [DATE]
OB History

 Gravidity:    3         Term:   1        Prem:   0        SAB:   1
 TOP:          0       Ectopic:  0        Living: 1
Gestational Age

 LMP:           33w 6d        Date:  [DATE]                 EDD:   [DATE]
 Best:          33w 6d     Det. By:  LMP  ([DATE])          EDD:   [DATE]

## 2021-10-05 NOTE — Patient Instructions (Signed)
[facility] [address], [place], [place] [address] [phone] Hours: Sunday Closed Monday 9AM-6PM Tuesday 9AM-6PM Wednesday 9AM-6PM Thursday 9AM-6PM Friday           9AM-6PM Saturday         10AM-1PM  

## 2021-10-05 NOTE — Progress Notes (Signed)

## 2021-10-05 NOTE — Progress Notes (Signed)
   PRENATAL VISIT NOTE  Subjective:  Kathy Browning is a 31 y.o. G3P1011 at [redacted]w[redacted]d being seen today for ongoing prenatal care.  She is currently monitored for the following issues for this high-risk pregnancy and has Low grade squamous intraepith lesion on cytologic smear cervix (lgsil); Supervision of other normal pregnancy, antepartum; Abnormal fetal ultrasound; Gestational diabetes; and Polyhydramnios affecting pregnancy in third trimester on their problem list.  Patient doing well with no acute concerns today. She reports no complaints.  Contractions: Irregular. Vag. Bleeding: None.  Movement: Present. Denies leaking of fluid.   The following portions of the patient's history were reviewed and updated as appropriate: allergies, current medications, past family history, past medical history, past social history, past surgical history and problem list. Problem list updated.  Objective:   Vitals:   10/05/21 0959  BP: 116/68  Pulse: 85  Weight: 179 lb 11.2 oz (81.5 kg)    Fetal Status: Fetal Heart Rate (bpm): NST Fundal Height: 34 cm Movement: Present     General:  Alert, oriented and cooperative. Patient is in no acute distress.  Skin: Skin is warm and dry. No rash noted.   Cardiovascular: Normal heart rate noted  Respiratory: Normal respiratory effort, no problems with respiration noted  Abdomen: Soft, gravid, appropriate for gestational age.  Pain/Pressure: Present     Pelvic: Cervical exam deferred        Extremities: Normal range of motion.     Mental Status:  Normal mood and affect. Normal behavior. Normal judgment and thought content.   Assessment and Plan:  Pregnancy: G3P1011 at [redacted]w[redacted]d  1. [redacted] weeks gestation of pregnancy   2. Gestational diabetes mellitus (GDM) in third trimester controlled on oral hypoglycemic drug Pt has limited blood sugars FBS: 94-97 PPBS: 120-130 Pt advised to bring more blood sugars next visit  3. Supervision of other normal pregnancy,  antepartum Continue routine prenatal care  4. Abnormal fetal ultrasound Absent nasal bone  Preterm labor symptoms and general obstetric precautions including but not limited to vaginal bleeding, contractions, leaking of fluid and fetal movement were reviewed in detail with the patient.  Please refer to After Visit Summary for other counseling recommendations.   Return in about 8 days (around 10/13/2021) for [facility], NST/BPP as scheduled.   Lynnda Shields, MD Faculty Attending Center for [facility]

## 2021-10-13 ENCOUNTER — Ambulatory Visit (INDEPENDENT_AMBULATORY_CARE_PROVIDER_SITE_OTHER): Payer: BC Managed Care – PPO | Admitting: Family Medicine

## 2021-10-13 ENCOUNTER — Ambulatory Visit (INDEPENDENT_AMBULATORY_CARE_PROVIDER_SITE_OTHER): Payer: BC Managed Care – PPO

## 2021-10-13 ENCOUNTER — Ambulatory Visit (INDEPENDENT_AMBULATORY_CARE_PROVIDER_SITE_OTHER): Payer: BC Managed Care – PPO | Admitting: General Practice

## 2021-10-13 VITALS — BP 119/72 | HR 91 | Wt 182.0 lb

## 2021-10-13 DIAGNOSIS — O24415 Gestational diabetes mellitus in pregnancy, controlled by oral hypoglycemic drugs: Secondary | ICD-10-CM

## 2021-10-13 DIAGNOSIS — Z3480 Encounter for supervision of other normal pregnancy, unspecified trimester: Secondary | ICD-10-CM

## 2021-10-13 DIAGNOSIS — Z3A35 35 weeks gestation of pregnancy: Secondary | ICD-10-CM

## 2021-10-13 MED ORDER — GLUCOSE BLOOD VI STRP: ORAL_STRIP | 12 refills | Status: DC

## 2021-10-13 NOTE — Progress Notes (Signed)
Pt informed that the ultrasound is considered a limited OB ultrasound and is not intended to be a complete ultrasound exam.  Patient also informed that the ultrasound is not being completed with the intent of assessing for fetal or placental anomalies or any pelvic abnormalities.  Explained that the purpose of today's ultrasound is to assess for  BPP, presentation, and AFI.  Patient acknowledges the purpose of the exam and the limitations of the study.     Shardea Cwynar H RN BSN 10/13/21  

## 2021-10-13 NOTE — Progress Notes (Signed)
Patient reports 1-2 headaches a week with dizziness that she attributes to out of range blood sugars

## 2021-10-19 NOTE — Progress Notes (Unsigned)
   PRENATAL VISIT NOTE  Subjective:  Kathy Browning is a 31 y.o. G3P1011 at [redacted]w[redacted]d being seen today for ongoing prenatal care.  She is currently monitored for the following issues for this {Blank single:19197::"high-risk","low-risk"} pregnancy and has Low grade squamous intraepith lesion on cytologic smear cervix (lgsil); Supervision of other normal pregnancy, antepartum; Abnormal fetal ultrasound; and Gestational diabetes on their problem list.  Patient reports {sx:14538}.   .  .   . Denies leaking of fluid.   The following portions of the patient's history were reviewed and updated as appropriate: allergies, current medications, past family history, past medical history, past social history, past surgical history and problem list.   Objective:  There were no vitals filed for this visit.  Fetal Status:           General:  Alert, oriented and cooperative. Patient is in no acute distress.  Skin: Skin is warm and dry. No rash noted.   Cardiovascular: Normal heart rate noted  Respiratory: Normal respiratory effort, no problems with respiration noted  Abdomen: Soft, gravid, appropriate for gestational age.        Pelvic: {Blank single:19197::"Cervical exam performed in the presence of a chaperone","Cervical exam deferred"}        Extremities: Normal range of motion.     Mental Status: Normal mood and affect. Normal behavior. Normal judgment and thought content.   Assessment and Plan:  Pregnancy: G3P1011 at [redacted]w[redacted]d 1. Supervision of other normal pregnancy, antepartum ***  {Blank single:19197::"Term","Preterm"} labor symptoms and general obstetric precautions including but not limited to vaginal bleeding, contractions, leaking of fluid and fetal movement were reviewed in detail with the patient. Please refer to After Visit Summary for other counseling recommendations.   No follow-ups on file.  Future Appointments  Date Time Provider Department Center  10/20/2021  2:15 PM [facility] NST [facility]  10/20/2021  3:55 PM Marylene Land, CNM [facility]  10/27/2021  2:55 PM Marylene Land, CNM [facility]  10/28/2021  3:30 PM [facility] NURSE [facility]  10/28/2021  3:45 PM [facility] US1 [facility]  11/03/2021  3:15 PM Marylene Land, CNM [facility]  11/10/2021  3:15 PM Anyanwu, Jethro Bastos, MD [facility]  11/17/2021  4:15 PM Marylene Land, CNM [facility]    Marylene Land, CNM

## 2021-10-20 ENCOUNTER — Ambulatory Visit: Payer: BC Managed Care – PPO | Admitting: *Deleted

## 2021-10-20 ENCOUNTER — Ambulatory Visit (INDEPENDENT_AMBULATORY_CARE_PROVIDER_SITE_OTHER): Payer: BC Managed Care – PPO | Admitting: Student

## 2021-10-20 ENCOUNTER — Other Ambulatory Visit: Payer: Self-pay

## 2021-10-20 ENCOUNTER — Other Ambulatory Visit (HOSPITAL_COMMUNITY)
Admission: RE | Admit: 2021-10-20 | Discharge: 2021-10-20 | Disposition: A | Discharge status: Home / Self Care | Payer: BC Managed Care – PPO | Source: Ambulatory Visit | Attending: Student | Admitting: Student

## 2021-10-20 ENCOUNTER — Ambulatory Visit (INDEPENDENT_AMBULATORY_CARE_PROVIDER_SITE_OTHER): Payer: BC Managed Care – PPO

## 2021-10-20 VITALS — BP 121/73 | HR 94 | Wt 182.9 lb

## 2021-10-20 DIAGNOSIS — Z3480 Encounter for supervision of other normal pregnancy, unspecified trimester: Secondary | ICD-10-CM | POA: Insufficient documentation

## 2021-10-20 DIAGNOSIS — O24415 Gestational diabetes mellitus in pregnancy, controlled by oral hypoglycemic drugs: Secondary | ICD-10-CM

## 2021-10-20 DIAGNOSIS — O403XX0 Polyhydramnios, third trimester, not applicable or unspecified: Secondary | ICD-10-CM

## 2021-10-20 DIAGNOSIS — Z3A36 36 weeks gestation of pregnancy: Secondary | ICD-10-CM

## 2021-10-20 NOTE — Progress Notes (Signed)
Polyhydramnios present today.  Pt states she is taking metformin twice daily and her blood sugars are "up and down".  Pt has [place] for growth and BPP @ [facility] on 7/7

## 2021-10-21 ENCOUNTER — Encounter: Payer: Self-pay | Admitting: Student

## 2021-10-21 ENCOUNTER — Encounter: Payer: Self-pay | Admitting: *Deleted

## 2021-10-21 LAB — CERVICOVAGINAL ANCILLARY ONLY
Chlamydia: NEGATIVE
Comment: NEGATIVE
Comment: NORMAL
Neisseria Gonorrhea: NEGATIVE

## 2021-10-23 LAB — CULTURE, BETA STREP (GROUP B ONLY): Strep Gp B Culture: POSITIVE — AB

## 2021-10-25 ENCOUNTER — Encounter: Payer: Self-pay | Admitting: Student

## 2021-10-25 DIAGNOSIS — B951 Streptococcus, group B, as the cause of diseases classified elsewhere: Secondary | ICD-10-CM | POA: Insufficient documentation

## 2021-10-27 ENCOUNTER — Telehealth (INDEPENDENT_AMBULATORY_CARE_PROVIDER_SITE_OTHER): Payer: BC Managed Care – PPO | Admitting: Student

## 2021-10-27 ENCOUNTER — Telehealth: Payer: Self-pay

## 2021-10-27 DIAGNOSIS — Z91199 Patient's noncompliance with other medical treatment and regimen due to unspecified reason: Secondary | ICD-10-CM

## 2021-10-27 NOTE — Telephone Encounter (Signed)
1st attempt to contact patient in regards to MyChart Virtual Visit. Patient did not answer and Hipaa compliant VM was left with office number.  Dawayne Patricia, CMA    10/27/21

## 2021-10-27 NOTE — Progress Notes (Signed)
Patient did not keep appt; she has appt at [facility] tomorrow, plan for IOL at 37 weeks if [facility] tomorrow with poly and poorly controlled sugars.

## 2021-10-28 ENCOUNTER — Ambulatory Visit: Payer: BC Managed Care – PPO | Admitting: *Deleted

## 2021-10-28 ENCOUNTER — Ambulatory Visit: Payer: BC Managed Care – PPO | Attending: Obstetrics and Gynecology

## 2021-10-28 VITALS — BP 127/68 | HR 98

## 2021-10-28 DIAGNOSIS — O409XX0 Polyhydramnios, unspecified trimester, not applicable or unspecified: Secondary | ICD-10-CM | POA: Insufficient documentation

## 2021-10-28 DIAGNOSIS — Q758 Other specified congenital malformations of skull and face bones: Secondary | ICD-10-CM | POA: Insufficient documentation

## 2021-10-28 DIAGNOSIS — O24415 Gestational diabetes mellitus in pregnancy, controlled by oral hypoglycemic drugs: Secondary | ICD-10-CM | POA: Diagnosis present

## 2021-10-28 DIAGNOSIS — O283 Abnormal ultrasonic finding on antenatal screening of mother: Secondary | ICD-10-CM

## 2021-10-28 DIAGNOSIS — O403XX0 Polyhydramnios, third trimester, not applicable or unspecified: Secondary | ICD-10-CM | POA: Diagnosis not present

## 2021-10-28 DIAGNOSIS — Z3A37 37 weeks gestation of pregnancy: Secondary | ICD-10-CM | POA: Diagnosis not present

## 2021-10-28 DIAGNOSIS — Z3480 Encounter for supervision of other normal pregnancy, unspecified trimester: Secondary | ICD-10-CM

## 2021-11-03 ENCOUNTER — Other Ambulatory Visit: Payer: Self-pay

## 2021-11-03 ENCOUNTER — Ambulatory Visit (INDEPENDENT_AMBULATORY_CARE_PROVIDER_SITE_OTHER): Payer: BC Managed Care – PPO | Admitting: Student

## 2021-11-03 VITALS — BP 121/69 | HR 91 | Wt 190.0 lb

## 2021-11-03 DIAGNOSIS — Z23 Encounter for immunization: Secondary | ICD-10-CM

## 2021-11-03 DIAGNOSIS — Z3A38 38 weeks gestation of pregnancy: Secondary | ICD-10-CM

## 2021-11-03 DIAGNOSIS — Z3483 Encounter for supervision of other normal pregnancy, third trimester: Secondary | ICD-10-CM

## 2021-11-03 DIAGNOSIS — O24419 Gestational diabetes mellitus in pregnancy, unspecified control: Secondary | ICD-10-CM

## 2021-11-03 DIAGNOSIS — Z3480 Encounter for supervision of other normal pregnancy, unspecified trimester: Secondary | ICD-10-CM

## 2021-11-03 MED ORDER — METFORMIN HCL 500 MG PO TABS: 1000.0000 mg | ORAL_TABLET | Freq: Two times a day (BID) | ORAL | 0 refills | Status: DC

## 2021-11-03 NOTE — Progress Notes (Signed)
   PRENATAL VISIT NOTE  Subjective:  Kathy Browning is a 32 y.o. G3P1011 at [redacted]w[redacted]d being seen today for ongoing prenatal care.  She is currently monitored for the following issues for this low-risk pregnancy and has Low grade squamous intraepith lesion on cytologic smear cervix (lgsil); Supervision of other normal pregnancy, antepartum; Abnormal fetal ultrasound; Gestational diabetes; Polyhydramnios affecting pregnancy in third trimester; and Positive GBS test on their problem list.  Patient reports no complaints.  Contractions: Irritability. Vag. Bleeding: None.  Movement: Present. Denies leaking of fluid.   The following portions of the patient's history were reviewed and updated as appropriate: allergies, current medications, past family history, past medical history, past social history, past surgical history and problem list.   Objective:   Vitals:   11/03/21 1532  BP: 121/69  Pulse: 91  Weight: 190 lb (86.2 kg)    Fetal Status: Fetal Heart Rate (bpm): 145   Movement: Present     General:  Alert, oriented and cooperative. Patient is in no acute distress.  Skin: Skin is warm and dry. No rash noted.   Cardiovascular: Normal heart rate noted  Respiratory: Normal respiratory effort, no problems with respiration noted  Abdomen: Soft, gravid, appropriate for gestational age.  Pain/Pressure: Present     Pelvic: Cervical exam deferred        Extremities: Normal range of motion.  Edema: Trace  Mental Status: Normal mood and affect. Normal behavior. Normal judgment and thought content.   Assessment and Plan:  Pregnancy: G3P1011 at [redacted]w[redacted]d 1. Supervision of other normal pregnancy, antepartum  -BPP not scheduled for this week by error from last week but we were able to schedule her for NST tomorrow; she reports strong fetal movements   -discussed sugars (available under media) from late June and into July and patient says that her sugars have been improved. Discussed with Dr. Debroah Loop, ok to  keep IOL for 39 weeks given no concern for growth and reassuring testing. Will bump up metformin to 1000 BID for the next week - Tdap vaccine greater than or equal to 7yo IM -revewed steps to IOL and when to come to hospital, reviewed importance of monitoring baby's movements.  Preterm labor symptoms and general obstetric precautions including but not limited to vaginal bleeding, contractions, leaking of fluid and fetal movement were reviewed in detail with the patient. Please refer to After Visit Summary for other counseling recommendations.   No follow-ups on file.  Future Appointments  Date Time Provider Department Center  11/04/2021  8:00 AM [facility] NST [facility]  11/10/2021  6:30 AM [facility] SCHED ROOM [facility] None    Marylene Land, CNM

## 2021-11-04 ENCOUNTER — Ambulatory Visit: Payer: BC Managed Care – PPO | Admitting: *Deleted

## 2021-11-04 ENCOUNTER — Ambulatory Visit (INDEPENDENT_AMBULATORY_CARE_PROVIDER_SITE_OTHER): Payer: BC Managed Care – PPO

## 2021-11-04 VITALS — BP 124/72 | HR 90 | Wt 190.0 lb

## 2021-11-04 DIAGNOSIS — O24415 Gestational diabetes mellitus in pregnancy, controlled by oral hypoglycemic drugs: Secondary | ICD-10-CM

## 2021-11-04 NOTE — Progress Notes (Signed)

## 2021-11-05 ENCOUNTER — Other Ambulatory Visit: Payer: Self-pay | Admitting: Family Medicine

## 2021-11-10 ENCOUNTER — Other Ambulatory Visit: Payer: Self-pay

## 2021-11-10 ENCOUNTER — Encounter (HOSPITAL_COMMUNITY): Payer: Self-pay | Admitting: Student

## 2021-11-10 ENCOUNTER — Inpatient Hospital Stay (HOSPITAL_COMMUNITY): Payer: BC Managed Care – PPO

## 2021-11-10 ENCOUNTER — Inpatient Hospital Stay (HOSPITAL_COMMUNITY): Admission: AD | Admit: 2021-11-10 | Payer: BC Managed Care – PPO | Source: Home / Self Care | Admitting: Family Medicine

## 2021-11-10 ENCOUNTER — Encounter: Payer: Self-pay | Admitting: Obstetrics & Gynecology

## 2021-11-10 ENCOUNTER — Inpatient Hospital Stay (HOSPITAL_COMMUNITY)
Admission: RE | Admit: 2021-11-10 | Discharge: 2021-11-13 | DRG: 806 | Disposition: A | Discharge status: Home / Self Care | Payer: BC Managed Care – PPO | Attending: Obstetrics & Gynecology | Admitting: Obstetrics & Gynecology

## 2021-11-10 DIAGNOSIS — O99892 Other specified diseases and conditions complicating childbirth: Secondary | ICD-10-CM | POA: Diagnosis present

## 2021-11-10 DIAGNOSIS — O24424 Gestational diabetes mellitus in childbirth, insulin controlled: Secondary | ICD-10-CM | POA: Diagnosis not present

## 2021-11-10 DIAGNOSIS — O403XX0 Polyhydramnios, third trimester, not applicable or unspecified: Secondary | ICD-10-CM | POA: Diagnosis present

## 2021-11-10 DIAGNOSIS — O283 Abnormal ultrasonic finding on antenatal screening of mother: Principal | ICD-10-CM

## 2021-11-10 DIAGNOSIS — D62 Acute posthemorrhagic anemia: Secondary | ICD-10-CM | POA: Diagnosis not present

## 2021-11-10 DIAGNOSIS — O99824 Streptococcus B carrier state complicating childbirth: Secondary | ICD-10-CM | POA: Diagnosis present

## 2021-11-10 DIAGNOSIS — D500 Iron deficiency anemia secondary to blood loss (chronic): Secondary | ICD-10-CM

## 2021-11-10 DIAGNOSIS — O99820 Streptococcus B carrier state complicating pregnancy: Secondary | ICD-10-CM | POA: Diagnosis not present

## 2021-11-10 DIAGNOSIS — Z3A39 39 weeks gestation of pregnancy: Secondary | ICD-10-CM | POA: Diagnosis not present

## 2021-11-10 DIAGNOSIS — Z87891 Personal history of nicotine dependence: Secondary | ICD-10-CM

## 2021-11-10 DIAGNOSIS — O24425 Gestational diabetes mellitus in childbirth, controlled by oral hypoglycemic drugs: Secondary | ICD-10-CM | POA: Diagnosis present

## 2021-11-10 DIAGNOSIS — O9081 Anemia of the puerperium: Secondary | ICD-10-CM | POA: Diagnosis not present

## 2021-11-10 DIAGNOSIS — R87612 Low grade squamous intraepithelial lesion on cytologic smear of cervix (LGSIL): Secondary | ICD-10-CM | POA: Diagnosis present

## 2021-11-10 DIAGNOSIS — O24419 Gestational diabetes mellitus in pregnancy, unspecified control: Secondary | ICD-10-CM | POA: Diagnosis present

## 2021-11-10 DIAGNOSIS — Z3480 Encounter for supervision of other normal pregnancy, unspecified trimester: Secondary | ICD-10-CM

## 2021-11-10 LAB — CBC
HCT: 30.5 % — ABNORMAL LOW (ref 36.0–46.0)
Hemoglobin: 10.1 g/dL — ABNORMAL LOW (ref 12.0–15.0)
MCH: 31.1 pg (ref 26.0–34.0)
MCHC: 33.1 g/dL (ref 30.0–36.0)
MCV: 93.8 fL (ref 80.0–100.0)
Platelets: 297 10*3/uL (ref 150–400)
RBC: 3.25 MIL/uL — ABNORMAL LOW (ref 3.87–5.11)
RDW: 15 % (ref 11.5–15.5)
WBC: 9.1 10*3/uL (ref 4.0–10.5)
nRBC: 0 % (ref 0.0–0.2)

## 2021-11-10 LAB — GLUCOSE, CAPILLARY: Glucose-Capillary: 113 mg/dL — ABNORMAL HIGH (ref 70–99)

## 2021-11-10 LAB — TYPE AND SCREEN
ABO/RH(D): O POS
Antibody Screen: NEGATIVE

## 2021-11-10 MED ORDER — LIDOCAINE HCL (PF) 1 % IJ SOLN: 30.0000 mL | INTRAMUSCULAR | Status: DC | PRN

## 2021-11-10 MED ORDER — LACTATED RINGERS IV SOLN: INTRAVENOUS | Status: DC

## 2021-11-10 MED ORDER — OXYCODONE-ACETAMINOPHEN 5-325 MG PO TABS: 2.0000 | ORAL_TABLET | ORAL | Status: DC | PRN

## 2021-11-10 MED ORDER — ACETAMINOPHEN 325 MG PO TABS: 650.0000 mg | ORAL_TABLET | ORAL | Status: DC | PRN

## 2021-11-10 MED ORDER — OXYTOCIN-SODIUM CHLORIDE 30-0.9 UT/500ML-% IV SOLN
2.5000 [IU]/h | INTRAVENOUS | Status: DC
  Administered 2021-11-12: 2.5 [IU]/h via INTRAVENOUS
  Filled 2021-11-10: qty 500

## 2021-11-10 MED ORDER — OXYTOCIN BOLUS FROM INFUSION
333.0000 mL | Freq: Once | INTRAVENOUS | Status: AC
  Administered 2021-11-12: 333 mL via INTRAVENOUS

## 2021-11-10 MED ORDER — SOD CITRATE-CITRIC ACID 500-334 MG/5ML PO SOLN: 30.0000 mL | ORAL | Status: DC | PRN

## 2021-11-10 MED ORDER — LACTATED RINGERS IV SOLN: 500.0000 mL | INTRAVENOUS | Status: DC | PRN

## 2021-11-10 MED ORDER — MISOPROSTOL 50MCG HALF TABLET
50.0000 ug | ORAL_TABLET | ORAL | Status: DC
Start: 2021-11-10 — End: 2021-11-11
  Administered 2021-11-10: 50 ug via ORAL
  Filled 2021-11-10: qty 1

## 2021-11-10 MED ORDER — PENICILLIN G POT IN DEXTROSE 60000 UNIT/ML IV SOLN
3.0000 10*6.[IU] | INTRAVENOUS | Status: DC
  Administered 2021-11-11 – 2021-11-12 (×8): 3 10*6.[IU] via INTRAVENOUS
  Filled 2021-11-10 (×10): qty 50

## 2021-11-10 MED ORDER — SODIUM CHLORIDE 0.9 % IV SOLN
5.0000 10*6.[IU] | Freq: Once | INTRAVENOUS | Status: AC
  Administered 2021-11-10: 5 10*6.[IU] via INTRAVENOUS
  Filled 2021-11-10: qty 5

## 2021-11-10 MED ORDER — OXYCODONE-ACETAMINOPHEN 5-325 MG PO TABS: 1.0000 | ORAL_TABLET | ORAL | Status: DC | PRN

## 2021-11-10 MED ORDER — TERBUTALINE SULFATE 1 MG/ML IJ SOLN: 0.2500 mg | Freq: Once | INTRAMUSCULAR | Status: DC | PRN

## 2021-11-10 MED ORDER — ONDANSETRON HCL 4 MG/2ML IJ SOLN
4.0000 mg | Freq: Four times a day (QID) | INTRAMUSCULAR | Status: DC | PRN
  Administered 2021-11-11 (×2): 4 mg via INTRAVENOUS
  Filled 2021-11-10 (×2): qty 2

## 2021-11-10 NOTE — H&P (Signed)
Kathy Browning is a 31 y.o. female presenting for Induction of Labor for Gestational Diabetes and Mild Polyhydramnios. Marland Kitchen  .Pregnancy has been followed at [facility] and remarkable for GDM, controlled with diet until recently when they added Metformin.  Ultrasounds have been normal except for mild polyhydramnios.  EFW was 32%ile (6+5)  Patient Active Problem List   Diagnosis Date Noted   Indication for care in labor and delivery, antepartum 11/10/2021   Positive GBS test 10/25/2021   Polyhydramnios affecting pregnancy in third trimester 09/08/2021   Gestational diabetes 08/27/2021   Abnormal fetal ultrasound 06/24/2021   Supervision of other normal pregnancy, antepartum 04/19/2021   Low grade squamous intraepith lesion on cytologic smear cervix (lgsil) 11/05/2018     Office Note: She is currently monitored for the following issues for this low-risk pregnancy and has Low grade squamous intraepith lesion on cytologic smear cervix (lgsil); Supervision of other normal pregnancy, antepartum; Abnormal fetal ultrasound; Gestational diabetes; Polyhydramnios affecting pregnancy in third trimester; and Positive GBS test on their problem list.  OB History     Gravida  3   Para  1   Term  1   Preterm      AB  1   Living  1      SAB  0   IAB  1   Ectopic      Multiple      Live Births  1          Past Medical History:  Diagnosis Date   Anemia    Past Surgical History:  Procedure Laterality Date   MOLE REMOVAL     TOE SURGERY     Family History: family history includes Hypertension in her maternal grandmother and mother. Social History:  reports that she quit smoking about 7 months ago. Her smoking use included cigarettes. She has never been exposed to tobacco smoke. She has never used smokeless tobacco. She reports that she does not currently use alcohol. She reports that she does not use drugs.     Maternal Diabetes: Yes:  Diabetes Type:  Insulin/Medication controlled Genetic  Screening: Normal Maternal Ultrasounds/Referrals: Other: mild Polyhydramnios (25cm) Fetal Ultrasounds or other Referrals:  None Maternal Substance Abuse:  No Significant Maternal Medications:  Meds include: Other: Metformin Significant Maternal Lab Results:  Group B Strep positive Other Comments:  None  Review of Systems  Constitutional:  Negative for chills and fever.  Eyes:  Negative for visual disturbance.  Respiratory:  Negative for shortness of breath.   Gastrointestinal:  Negative for abdominal pain, nausea and vomiting.  Genitourinary:  Negative for vaginal bleeding.   Maternal Medical History:  Reason for admission: Nausea. IOL for GDM and Polyhydramnios   Contractions: Frequency: irregular.   Perceived severity is mild.   Fetal activity: Perceived fetal activity is normal.   Last perceived fetal movement was within the past hour.   Prenatal complications: Polyhydramnios.   No bleeding, PIH, IUGR or placental abnormality.   Prenatal Complications - Diabetes: gestational. Diabetes is managed by oral agent (monotherapy).     Dilation: Closed Effacement (%): 30 Station: -2 Exam by:: Wynelle Bourgeois, CNM Blood pressure 119/65, pulse 90, temperature 98.1 F (36.7 C), temperature source Oral, resp. rate 18, height 5\' 3"  (1.6 m), weight 86.3 kg, last menstrual period 02/10/2021. Maternal Exam:  Uterine Assessment: Contraction strength is mild.  Contraction frequency is irregular.  Abdomen: Patient reports no abdominal tenderness. Fetal presentation: vertex Introitus: Normal vulva. Normal vagina.  Ferning test: not done.  Nitrazine test: not done. Pelvis: adequate for delivery.   Cervix: Cervix evaluated by digital exam.     Fetal Exam Fetal Monitor Review: Mode: ultrasound.   Baseline rate: 130.  Variability: moderate (6-25 bpm).   Pattern: accelerations present and no decelerations.   Fetal State Assessment: Category I - tracings are normal.   Physical  Exam Constitutional:      General: She is not in acute distress.    Appearance: She is not ill-appearing or toxic-appearing.  HENT:     Head: Normocephalic.  Cardiovascular:     Rate and Rhythm: Normal rate.  Pulmonary:     Effort: Pulmonary effort is normal.  Abdominal:     General: There is no distension.     Tenderness: There is no abdominal tenderness. There is no guarding.  Genitourinary:    General: Normal vulva.     Comments: Dilation: Closed Effacement (%): 30 Station: -2 Presentation: Vertex Exam by:: Wynelle Bourgeois, CNM  Musculoskeletal:        General: Normal range of motion.     Cervical back: Normal range of motion.  Skin:    General: Skin is warm and dry.  Neurological:     General: No focal deficit present.     Mental Status: She is alert.  Psychiatric:        Mood and Affect: Mood normal.     Prenatal labs: ABO, Rh: --/--/O POS (07/20 2147) Antibody: NEG (07/20 2147) Rubella: 7.52 (01/11 1607) RPR: Non Reactive (05/03 0844)  HBsAg: Negative (01/11 1607)  HIV: Non Reactive (05/03 0844)  GBS: Positive/-- (06/29 1707)   Assessment/Plan: Single IUP at [redacted]w[redacted]d Gestational Diabetes, A2 Mild polyhdramnios GBS Positive  Admit to Labor and Delivery Routine orders Penicillin prophylaxis Will give Cytotec 50mg  q 4 hrs.   Insert Foley when able Anticipate SVD   11/10/2021, 10:53 PM

## 2021-11-11 ENCOUNTER — Inpatient Hospital Stay (HOSPITAL_COMMUNITY): Payer: BC Managed Care – PPO | Admitting: Anesthesiology

## 2021-11-11 LAB — RPR: RPR Ser Ql: NONREACTIVE

## 2021-11-11 LAB — GLUCOSE, CAPILLARY
Glucose-Capillary: 95 mg/dL (ref 70–99)
Glucose-Capillary: 89 mg/dL (ref 70–99)
Glucose-Capillary: 88 mg/dL (ref 70–99)
Glucose-Capillary: 95 mg/dL (ref 70–99)
Glucose-Capillary: 99 mg/dL (ref 70–99)

## 2021-11-11 MED ORDER — MISOPROSTOL 25 MCG QUARTER TABLET: 25.0000 ug | ORAL_TABLET | ORAL | Status: DC

## 2021-11-11 MED ORDER — LIDOCAINE HCL (PF) 1 % IJ SOLN
INTRAMUSCULAR | Status: DC | PRN
  Administered 2021-11-11 (×2): 4 mL via EPIDURAL

## 2021-11-11 MED ORDER — EPHEDRINE 5 MG/ML INJ: 10.0000 mg | INTRAVENOUS | Status: DC | PRN

## 2021-11-11 MED ORDER — SODIUM CHLORIDE 0.9 % IV SOLN
25.0000 mg | INTRAVENOUS | Status: DC | PRN
  Administered 2021-11-11: 25 mg via INTRAVENOUS
  Filled 2021-11-11: qty 1

## 2021-11-11 MED ORDER — FENTANYL-BUPIVACAINE-NACL 0.5-0.125-0.9 MG/250ML-% EP SOLN
12.0000 mL/h | EPIDURAL | Status: DC | PRN
  Administered 2021-11-11: 12 mL/h via EPIDURAL
  Filled 2021-11-11: qty 250

## 2021-11-11 MED ORDER — FENTANYL CITRATE (PF) 100 MCG/2ML IJ SOLN
INTRAMUSCULAR | Status: AC
  Administered 2021-11-11: 100 ug via INTRAVENOUS
  Filled 2021-11-11: qty 2

## 2021-11-11 MED ORDER — FAMOTIDINE IN NACL 20-0.9 MG/50ML-% IV SOLN
20.0000 mg | Freq: Two times a day (BID) | INTRAVENOUS | Status: DC
  Administered 2021-11-11 – 2021-11-12 (×2): 20 mg via INTRAVENOUS
  Filled 2021-11-11 (×3): qty 50

## 2021-11-11 MED ORDER — PHENYLEPHRINE 80 MCG/ML (10ML) SYRINGE FOR IV PUSH (FOR BLOOD PRESSURE SUPPORT): 80.0000 ug | PREFILLED_SYRINGE | INTRAVENOUS | Status: DC | PRN

## 2021-11-11 MED ORDER — LACTATED RINGERS IV SOLN: 500.0000 mL | Freq: Once | INTRAVENOUS | Status: DC

## 2021-11-11 MED ORDER — FENTANYL CITRATE (PF) 100 MCG/2ML IJ SOLN
100.0000 ug | INTRAMUSCULAR | Status: DC | PRN
  Administered 2021-11-11 (×3): 100 ug via INTRAVENOUS
  Filled 2021-11-11 (×3): qty 2

## 2021-11-11 MED ORDER — DIPHENHYDRAMINE HCL 50 MG/ML IJ SOLN: 12.5000 mg | INTRAMUSCULAR | Status: DC | PRN

## 2021-11-11 MED ORDER — MISOPROSTOL 50MCG HALF TABLET
50.0000 ug | ORAL_TABLET | ORAL | Status: DC | PRN
  Administered 2021-11-11: 50 ug via BUCCAL
  Filled 2021-11-11: qty 1

## 2021-11-11 MED ORDER — TERBUTALINE SULFATE 1 MG/ML IJ SOLN: 0.2500 mg | Freq: Once | INTRAMUSCULAR | Status: DC | PRN

## 2021-11-11 MED ORDER — OXYTOCIN-SODIUM CHLORIDE 30-0.9 UT/500ML-% IV SOLN
1.0000 m[IU]/min | INTRAVENOUS | Status: DC
  Administered 2021-11-11: 2 m[IU]/min via INTRAVENOUS
  Filled 2021-11-11: qty 500

## 2021-11-11 MED ORDER — MISOPROSTOL 25 MCG QUARTER TABLET
25.0000 ug | ORAL_TABLET | ORAL | Status: DC | PRN
  Administered 2021-11-11: 25 ug via ORAL
  Filled 2021-11-11: qty 1

## 2021-11-11 NOTE — Progress Notes (Signed)
Patient ID: Kathy Browning, female   DOB: [birth year], 31 y.o.   MRN: [id number] Feeling more pain with contractions Having some nausea and reflux  Vitals:   11/10/21 2152 11/10/21 2229 11/11/21 0023 11/11/21 0206  BP: 119/65 113/65 119/74 124/70  Pulse: 90 82 82 81  Resp: 18 17 18 17   Temp: 98.1 F (36.7 C)   97.8 F (36.6 C)  TempSrc: Oral   Oral  Weight: 86.3 kg     Height: 5\' 3"  (1.6 m)      FHR reactive UCs intermittently frequent  Dilation: Fingertip Effacement (%): 50 Cervical Position: Posterior Station: -3, Ballotable Presentation: Vertex Exam by:: CNM  Will wait on next Cytotec until UCs space out Then will start Cytotec [email or web address] PO q 2hrs PRN until we get more cervical change This half dose more frequently will optimize the peak times of drug level  WIll give Fentanyl for pain WIll give Zofran and/or Phenergan for nausea  Pepcid for reflux.  Will try Foley next exam

## 2021-11-11 NOTE — Progress Notes (Signed)
Patient ID: Shatina Streets, female   DOB: [birth year], 31 y.o.   MRN: [id number] Doing well  Vitals:   11/11/21 2002 11/11/21 2007 11/11/21 2010 11/11/21 2020  BP: 113/70 109/65 112/67 109/67  Pulse: 80 82 78 81  Resp:      Temp:      TempSrc:      Weight:      Height:       FHR reactive  Last Cervix exam: Dilation: 5.5 Effacement (%): 70 Cervical Position: Posterior Station: -3 Presentation: Vertex Exam by:: K.Louis-Charles, RN  Anticipate SVD

## 2021-11-11 NOTE — Progress Notes (Signed)
Labor Progress Note Kathy Browning is a 31 y.o. G3P1011 at [redacted]w[redacted]d who presented for IOL due to A2GDM.  S: Doing well. Epidural just recently placed. No concerns.   O:  BP 109/67   Pulse 81   Temp 98.3 F (36.8 C) (Oral)   Resp 18   Ht 5\' 3"  (1.6 m)   Wt 86.3 kg   LMP 02/10/2021 (Exact Date)   BMI 33.69 kg/m   EFM: Baseline 135 bpm, moderate variability, + accels, no decels  Toco: Every 5-6 minutes   CVE: Dilation: 5.5 Effacement (%): 70 Cervical Position: Posterior Station: -3 Presentation: Vertex Exam by:: K.Louis-Charles, RN  A&P: 31 y.o. 37 [redacted]w[redacted]d   #Labor: Progressing well s/p Cytotec and foley balloon. Will start Pitocin 2x2 and reassess in 4 hours. Plan for AROM on next exam as appropriate.  #Pain: Epidural  #FWB: Cat 1  #GBS positive; receiving PCN  #A2GDM: CBGs within normal range. Will continue to monitor every 4 hours.   [redacted]w[redacted]d, MD 8:52 PM

## 2021-11-11 NOTE — Anesthesia Procedure Notes (Signed)
Epidural Patient location during procedure: OB Start time: 11/11/2021 7:39 PM End time: 11/11/2021 7:42 PM  Staffing Anesthesiologist: Kaylyn Layer, MD Performed: anesthesiologist   Preanesthetic Checklist Completed: patient identified, IV checked, risks and benefits discussed, monitors and equipment checked, pre-op evaluation and timeout performed  Epidural Patient position: sitting Prep: DuraPrep and site prepped and draped Patient monitoring: continuous pulse ox, blood pressure and heart rate Approach: midline Location: L3-L4 Injection technique: LOR air  Needle:  Needle type: Tuohy  Needle gauge: 17 G Needle length: 9 cm Needle insertion depth: 5 cm Catheter type: closed end flexible Catheter size: 19 Gauge Catheter at skin depth: 10 cm Test dose: negative and Other (1% lidocaine)  Assessment Events: blood not aspirated, injection not painful, no injection resistance, no paresthesia and negative IV test  Additional Notes Patient identified. Risks, benefits, and alternatives discussed with patient including but not limited to bleeding, infection, nerve damage, paralysis, failed block, incomplete pain control, headache, blood pressure changes, nausea, vomiting, reactions to medication, itching, and postpartum back pain. Confirmed with bedside nurse the patient's most recent platelet count. Confirmed with patient that they are not currently taking any anticoagulation, have any bleeding history, or any family history of bleeding disorders. Patient expressed understanding and wished to proceed. All questions were answered. Sterile technique was used throughout the entire procedure. Please see nursing notes for vital signs.   Crisp LOR on first pass. Test dose was given through epidural catheter and negative prior to continuing to dose epidural or start infusion. Warning signs of high block given to the patient including shortness of breath, tingling/numbness in hands, complete  motor block, or any concerning symptoms with instructions to call for help. Patient was given instructions on fall risk and not to get out of bed. All questions and concerns addressed with instructions to call with any issues or inadequate analgesia.  Reason for block:procedure for pain

## 2021-11-11 NOTE — Progress Notes (Signed)
Labor Progress Note Charletha Dalpe is a 31 y.o. G3P1011 at [redacted]w[redacted]d who presented for IOL due to A2GDM.  S: Doing well. No concerns. Partner at bedside.   O:  BP 106/64   Pulse 77   Temp 97.9 F (36.6 C) (Oral)   Resp 16   Ht 5\' 3"  (1.6 m)   Wt 86.3 kg   LMP 02/10/2021 (Exact Date)   BMI 33.69 kg/m   EFM: Baseline 135 bpm, moderate variability, + accels, no decels  Toco: Every 2-5 minutes   CVE: Dilation: 1 Effacement (%): 50 Cervical Position: Posterior Station: -3 Presentation: Vertex Exam by:: hstone rnc  A&P: 31 y.o. G3P1011 [redacted]w[redacted]d   #Labor: Progressing well. Additional dose of buccal Cytotec given. Will reassess in 4 hours. Plan for foley balloon placement on next exam as able.  #Pain: PRN; IV Fentanyl for now  #FWB: Cat 1  #GBS positive; receiving PCN  #A2GDM: CBGs within normal range. Will continue to check every 4 hours in latent phase.   [redacted]w[redacted]d, MD 9:48 AM

## 2021-11-11 NOTE — Anesthesia Preprocedure Evaluation (Signed)
Anesthesia Evaluation  Patient identified by MRN, date of birth, ID band Patient awake    Reviewed: Allergy & Precautions, Patient's Chart, lab work & pertinent test results  History of Anesthesia Complications Negative for: history of anesthetic complications  Airway Mallampati: II  TM Distance: >3 FB Neck ROM: Full    Dental no notable dental hx.    Pulmonary former smoker,    Pulmonary exam normal        Cardiovascular negative cardio ROS Normal cardiovascular exam     Neuro/Psych negative neurological ROS  negative psych ROS   GI/Hepatic negative GI ROS, Neg liver ROS,   Endo/Other  diabetes, Gestational  Renal/GU negative Renal ROS  negative genitourinary   Musculoskeletal negative musculoskeletal ROS (+)   Abdominal   Peds  Hematology  (+) Blood dyscrasia (Hgb 10.1, Plt 297k), anemia ,   Anesthesia Other Findings Day of surgery medications reviewed with patient.  Reproductive/Obstetrics (+) Pregnancy                             Anesthesia Physical Anesthesia Plan  ASA: 2  Anesthesia Plan: Epidural   Post-op Pain Management:    Induction:   PONV Risk Score and Plan: Treatment may vary due to age or medical condition  Airway Management Planned: Natural Airway  Additional Equipment: Fetal Monitoring  Intra-op Plan:   Post-operative Plan:   Informed Consent: I have reviewed the patients History and Physical, chart, labs and discussed the procedure including the risks, benefits and alternatives for the proposed anesthesia with the patient or authorized representative who has indicated his/her understanding and acceptance.       Plan Discussed with:   Anesthesia Plan Comments:         Anesthesia Quick Evaluation

## 2021-11-11 NOTE — Progress Notes (Signed)
Labor Progress Note Kathy Browning is a 31 y.o. G3P1011 at [redacted]w[redacted]d who presented for IOL due to A2GDM.  S: Doing well. No concerns. Partner at bedside.   O:  BP 114/68   Pulse 87   Temp 99 F (37.2 C) (Oral)   Resp 18   Ht 5\' 3"  (1.6 m)   Wt 86.3 kg   LMP 02/10/2021 (Exact Date)   BMI 33.69 kg/m   EFM: Baseline 135 bpm, moderate variability, + accels, no decels  Toco: Every  3-5 minutes   CVE: Dilation: 2 Effacement (%): 50 Cervical Position: Posterior Station: -3 Presentation: Vertex Exam by:: Dr [email or web address]  A&P: 31 y.o. G3P1011 [redacted]w[redacted]d   #Labor: Progressing well. Foley balloon placed this check without difficulty. Mom and baby tolerated this well. Contracting every 3-5 minutes. Will hold on additional dose of Cytotec for now. Will give additional dose if contractions space. Will reassess in 4 hours.  #Pain: PRN; coping well  #FWB: Cat 1  #GBS positive; receiving PCN  #A2GDM: CBGs within normal range. Will continue to check every 4 hours in latent phase.   [redacted]w[redacted]d, MD 4:02 PM

## 2021-11-12 ENCOUNTER — Encounter (HOSPITAL_COMMUNITY): Payer: Self-pay | Admitting: Obstetrics and Gynecology

## 2021-11-12 DIAGNOSIS — O24424 Gestational diabetes mellitus in childbirth, insulin controlled: Secondary | ICD-10-CM

## 2021-11-12 DIAGNOSIS — Z3A39 39 weeks gestation of pregnancy: Secondary | ICD-10-CM

## 2021-11-12 DIAGNOSIS — O403XX0 Polyhydramnios, third trimester, not applicable or unspecified: Secondary | ICD-10-CM

## 2021-11-12 DIAGNOSIS — O99820 Streptococcus B carrier state complicating pregnancy: Secondary | ICD-10-CM

## 2021-11-12 LAB — GLUCOSE, CAPILLARY
Glucose-Capillary: 88 mg/dL (ref 70–99)
Glucose-Capillary: 75 mg/dL (ref 70–99)
Glucose-Capillary: 114 mg/dL — ABNORMAL HIGH (ref 70–99)
Glucose-Capillary: 91 mg/dL (ref 70–99)

## 2021-11-12 MED ORDER — ONDANSETRON HCL 4 MG/2ML IJ SOLN: 4.0000 mg | INTRAMUSCULAR | Status: DC | PRN

## 2021-11-12 MED ORDER — DIPHENHYDRAMINE HCL 25 MG PO CAPS: 25.0000 mg | ORAL_CAPSULE | Freq: Four times a day (QID) | ORAL | Status: DC | PRN

## 2021-11-12 MED ORDER — WITCH HAZEL-GLYCERIN EX PADS
1.0000 | MEDICATED_PAD | CUTANEOUS | Status: DC | PRN
Start: 2021-11-12 — End: 2021-11-13

## 2021-11-12 MED ORDER — LABETALOL HCL 200 MG PO TABS: 200.0000 mg | ORAL_TABLET | Freq: Two times a day (BID) | ORAL | Status: DC

## 2021-11-12 MED ORDER — DIBUCAINE (PERIANAL) 1 % EX OINT: 1.0000 | TOPICAL_OINTMENT | CUTANEOUS | Status: DC | PRN

## 2021-11-12 MED ORDER — TETANUS-DIPHTH-ACELL PERTUSSIS 5-2.5-18.5 LF-MCG/0.5 IM SUSY: 0.5000 mL | PREFILLED_SYRINGE | Freq: Once | INTRAMUSCULAR | Status: DC

## 2021-11-12 MED ORDER — LABETALOL HCL 5 MG/ML IV SOLN: 40.0000 mg | INTRAVENOUS | Status: DC | PRN

## 2021-11-12 MED ORDER — SIMETHICONE 80 MG PO CHEW: 80.0000 mg | CHEWABLE_TABLET | ORAL | Status: DC | PRN

## 2021-11-12 MED ORDER — SENNOSIDES-DOCUSATE SODIUM 8.6-50 MG PO TABS
2.0000 | ORAL_TABLET | Freq: Every day | ORAL | Status: DC
  Administered 2021-11-13: 2 via ORAL
  Filled 2021-11-12: qty 2

## 2021-11-12 MED ORDER — ONDANSETRON HCL 4 MG PO TABS: 4.0000 mg | ORAL_TABLET | ORAL | Status: DC | PRN

## 2021-11-12 MED ORDER — COCONUT OIL OIL: 1.0000 | TOPICAL_OIL | Status: DC | PRN

## 2021-11-12 MED ORDER — PRENATAL MULTIVITAMIN CH
1.0000 | ORAL_TABLET | Freq: Every day | ORAL | Status: DC
  Administered 2021-11-13: 1 via ORAL
  Filled 2021-11-12: qty 1

## 2021-11-12 MED ORDER — ACETAMINOPHEN 325 MG PO TABS: 650.0000 mg | ORAL_TABLET | ORAL | Status: DC | PRN

## 2021-11-12 MED ORDER — HYDRALAZINE HCL 20 MG/ML IJ SOLN: 10.0000 mg | INTRAMUSCULAR | Status: DC | PRN

## 2021-11-12 MED ORDER — BENZOCAINE-MENTHOL 20-0.5 % EX AERO
1.0000 | INHALATION_SPRAY | CUTANEOUS | Status: DC | PRN
  Administered 2021-11-12: 1 via TOPICAL
  Filled 2021-11-12: qty 56

## 2021-11-12 MED ORDER — LABETALOL HCL 5 MG/ML IV SOLN: 80.0000 mg | INTRAVENOUS | Status: DC | PRN

## 2021-11-12 MED ORDER — IBUPROFEN 600 MG PO TABS
600.0000 mg | ORAL_TABLET | Freq: Four times a day (QID) | ORAL | Status: DC
  Administered 2021-11-12 – 2021-11-13 (×4): 600 mg via ORAL
  Filled 2021-11-12 (×4): qty 1

## 2021-11-12 MED ORDER — LABETALOL HCL 5 MG/ML IV SOLN: 20.0000 mg | INTRAVENOUS | Status: DC | PRN

## 2021-11-12 NOTE — Lactation Note (Signed)
This note was copied from a baby's chart. Lactation Consultation Note  Patient Name: Kathy Browning [id number] Date: 11/12/2021 Reason for consult: Initial assessment;1st time breastfeeding;Term;Breastfeeding assistance Age:31 years  P2, Term, Infant female  LC entered the room and baby was latched to mom's left breast. Mom wanted to know if baby was able to breathe and if she was getting anything. LC spoke with mom about infant stomach size in the first few days of life, infant behavior, infant I/O in 1st 24 hours, hand expression, and supply and demand.   Baby began to suckle at the breast and mom stated that the latch was painful. LC took baby off the breast and mom's nipple was pinched. LC spoke with mom about the importance of a deep latch and she is aware that she should feel a tug, but not a pinch. LC demonstrated how to break the latch to mom.   LC reviewed outpatient services brochure and sent [facility] referral. Mom does not have a pump at home.   Mom was starting to get sleepy. LC encouraged the support person to put baby in the bassinet if mom is falling asleep.   Current Feeding Plan:  Breastfeed baby 8+ times in 24 hours according to feeding cues.  Hand express and feed expressed milk to baby via a spoon.  Call RN/LC for latch assistance.   Maternal Data Has patient been taught Hand Expression?: Yes Does the patient have breastfeeding experience prior to this delivery?: No  Feeding Mother's Current Feeding Choice: Breast Milk  LATCH Score Latch: Grasps breast easily, tongue down, lips flanged, rhythmical sucking.  Audible Swallowing: None  Type of Nipple: Everted at rest and after stimulation  Comfort (Breast/Nipple): Filling, red/small blisters or bruises, mild/mod discomfort  Hold (Positioning): Assistance needed to correctly position infant at breast and maintain latch.  LATCH Score: 6   Lactation Tools Discussed/Used    Interventions Interventions: Assisted  with latch;Hand express;Adjust position  Discharge Pump:  (LC sent [facility] referral.) WIC Program: Yes  Consult Status Consult Status: Follow-up Date: 11/13/21    Delene Loll 11/12/2021, 4:30 PM

## 2021-11-12 NOTE — Anesthesia Postprocedure Evaluation (Signed)
Anesthesia Post Note  Patient: Sherissa Tenenbaum  Procedure(s) Performed: AN AD HOC LABOR EPIDURAL     Patient location during evaluation: Mother Baby Anesthesia Type: Epidural Level of consciousness: awake and alert Pain management: pain level controlled Vital Signs Assessment: post-procedure vital signs reviewed and stable Respiratory status: spontaneous breathing, nonlabored ventilation and respiratory function stable Cardiovascular status: stable Postop Assessment: no headache, no backache and epidural receding Anesthetic complications: no   No notable events documented.  Last Vitals:  Vitals:   11/12/21 1432 11/12/21 1512  BP: 117/69 125/76  Pulse: 73 75  Resp:  18  Temp:  36.8 C  SpO2:  100%    Last Pain:  Vitals:   11/12/21 1512  TempSrc: Oral  PainSc:    Pain Goal:                   Wilfrid Hyser

## 2021-11-12 NOTE — Progress Notes (Signed)
Patient ID: Kathy Browning, female   DOB: October 13, 1990, 31 y.o.   MRN: [id number] Comfortable with epidural  Vitals:   11/11/21 2007 11/11/21 2010 11/11/21 2020 11/12/21 0302  BP: 109/65 112/67 109/67 (!) 95/55  Pulse: 82 78 81 72  Resp:      Temp:      TempSrc:      Weight:      Height:       FHR reassuring UCs regular  Dilation: 5.5 Effacement (%): 70 Cervical Position: Posterior Station: -3 Presentation: Vertex Exam by:: K.Louis-Charles, RN  Continue plan of care Consider AROM

## 2021-11-12 NOTE — Lactation Note (Signed)
This note was copied from a baby's chart. Lactation Consultation Note  Patient Name: Kathy Browning [id number] Date: 11/12/2021 Reason for consult: L&D Initial assessment;Term Age:30 hours   Initial L&D Consult:  Visited with family < 1 hour after birth Baby was quiet and awake; STS on mother's chest.  Attempted to latch, however, she was not at all interested in opening her mouth.  Reassurance given.  Placed her back STS and she continued to be quiet and awake; gazing at mother.  Informed parents that lactation services will be available on the M/B unit.  Father at bedside along with one other visitor.   Maternal Data    Feeding Mother's Current Feeding Choice: Breast Milk  LATCH Score Latch: Too sleepy or reluctant, no latch achieved, no sucking elicited.  Audible Swallowing: None  Type of Nipple: Everted at rest and after stimulation  Comfort (Breast/Nipple): Soft / non-tender  Hold (Positioning): Assistance needed to correctly position infant at breast and maintain latch.  LATCH Score: 5   Lactation Tools Discussed/Used    Interventions Interventions: Assisted with latch;Skin to skin  Discharge    Consult Status Consult Status: Follow-up from L&D    Treysean Petruzzi R [name] 11/12/2021, 1:59 PM

## 2021-11-12 NOTE — Progress Notes (Signed)
Patient ID: Kathy Browning, female   DOB: 09-17-1990, 31 y.o.   MRN: [id number] AROM for light yellow fluid IUPC inserted  FHR reassuring UCs q9min  Will continue Pitocin

## 2021-11-12 NOTE — Discharge Summary (Signed)
Postpartum Discharge Summary  Date of Service updated***     Patient Name: Kathy Browning DOB: 19-Oct-1990 MRN: [id number]  Date of admission: 11/10/2021 Delivery date:11/12/2021  Delivering provider: Gerlene Fee  Date of discharge: 11/12/2021  Admitting diagnosis: Indication for care in labor and delivery, antepartum [O75.9] Intrauterine pregnancy: [redacted]w[redacted]d     Secondary diagnosis:  Principal Problem:   Indication for care in labor and delivery, antepartum  Additional problems: ***    Discharge diagnosis: Term Pregnancy Delivered and GDM A2                                              Post partum procedures:{Postpartum procedures:23558} Augmentation: AROM, Pitocin, Cytotec, and IP Foley Complications: None  Hospital course: Induction of Labor With Vaginal Delivery   31 y.o. yo [id number] at [redacted]w[redacted]d was admitted to the hospital 11/10/2021 for induction of labor.  Indication for induction: A2 DM.  Patient had an uncomplicated labor course as follows: Membrane Rupture Time/Date: 5:06 AM ,11/12/2021   Delivery Method:Vaginal, Spontaneous  Episiotomy: None  Lacerations:  1st degree;Periurethral;Perineal  Details of delivery can be found in separate delivery note.  Patient had a routine postpartum course. Patient is discharged home 11/12/21.  Newborn Data: Birth date:11/12/2021  Birth time:1:18 PM  Gender:Female  Living status:Living  Apgars:9 ,9  Weight:3204 g   Magnesium Sulfate received: No BMZ received: No Rhophylac:No MMR:No T-DaP:*** Flu: N/A Transfusion:{Transfusion received:30440034}  Physical exam  Vitals:   11/12/21 1402 11/12/21 1417 11/12/21 1432 11/12/21 1512  BP: 120/64 128/81 117/69 125/76  Pulse: 83 81 73 75  Resp:    18  Temp:    98.3 F (36.8 C)  TempSrc:    Oral  SpO2:    100%  Weight:      Height:       General: {Exam; general:21111117} Lochia: {Desc; appropriate/inappropriate:30686::"appropriate"} Uterine Fundus: {Desc;  firm/soft:30687} Incision: {Exam; incision:21111123} DVT Evaluation: {Exam; dvt:2111122} Labs: Lab Results  Component Value Date   WBC 9.1 11/10/2021   HGB 10.1 (L) 11/10/2021   HCT 30.5 (L) 11/10/2021   MCV 93.8 11/10/2021   PLT 297 11/10/2021       No data to display         Edinburgh Score:     No data to display           After visit meds:  Allergies as of 11/12/2021   No Known Allergies   Med Rec must be completed prior to using this [facility]***        Discharge home in stable condition Infant Feeding: Breast Infant Disposition:{CHL IP OB HOME WITH KVQQVZ:56387} Discharge instruction: per After Visit Summary and Postpartum booklet. Activity: Advance as tolerated. Pelvic rest for 6 weeks.  Diet: routine diet Future Appointments:No future appointments. Follow up Visit:   Please schedule this patient for a In person postpartum visit in 6 weeks with the following provider: Any provider. Additional Postpartum F/U:2 hour GTT  High risk pregnancy complicated by: GDM Delivery mode:  Vaginal, Spontaneous  Anticipated Birth Control:  IUD outpatient   11/12/2021 Naaman Plummer Autry-Lott, DO

## 2021-11-12 NOTE — Progress Notes (Signed)
Labor Progress Note Kathy Browning is a 31 y.o. G3P1011 at [redacted]w[redacted]d who presented for IOL due to A2GDM.   S: Doing well. No concerns. Family at bedside.   O:  BP 128/80   Pulse 82   Temp 97.9 F (36.6 C) (Oral)   Resp 16   Ht 5\' 3"  (1.6 m)   Wt 86.3 kg   LMP 02/10/2021 (Exact Date)   SpO2 99%   BMI 33.69 kg/m   EFM: Baseline 135 bpm, moderate variability, + accels, early/variable decels with contractions   CVE: Dilation: 10 Effacement (%): 100 Cervical Position: Posterior Station: Plus 2 Presentation: Vertex Exam by:: Olyver Hawes  A&P: 31 y.o. G3P1011 [redacted]w[redacted]d   #Labor: Progressing well. Complete and plus 2 station. Will start trial of pushing. Anticipate SVD.  #Pain: Epidural  #FWB: Cat 2 due to intermittent variable decels with contractions. Reassuring variability and accels. Will continue to monitor closely.  #GBS positive; receiving PCN  #A2GDM: CBGs at goal. Continue to check every 2 hours in active phase. Plan for fasting CBG postpartum.   [redacted]w[redacted]d, MD 1:08 PM

## 2021-11-13 DIAGNOSIS — D500 Iron deficiency anemia secondary to blood loss (chronic): Secondary | ICD-10-CM

## 2021-11-13 LAB — CBC
HCT: 24.3 % — ABNORMAL LOW (ref 36.0–46.0)
Hemoglobin: 8.1 g/dL — ABNORMAL LOW (ref 12.0–15.0)
MCH: 31.6 pg (ref 26.0–34.0)
MCHC: 33.3 g/dL (ref 30.0–36.0)
MCV: 94.9 fL (ref 80.0–100.0)
Platelets: 241 10*3/uL (ref 150–400)
RBC: 2.56 MIL/uL — ABNORMAL LOW (ref 3.87–5.11)
RDW: 15.2 % (ref 11.5–15.5)
WBC: 14.2 10*3/uL — ABNORMAL HIGH (ref 4.0–10.5)
nRBC: 0 % (ref 0.0–0.2)

## 2021-11-13 MED ORDER — SENNOSIDES-DOCUSATE SODIUM 8.6-50 MG PO TABS: 2.0000 | ORAL_TABLET | Freq: Every day | ORAL | 0 refills | Status: DC | PRN

## 2021-11-13 MED ORDER — IBUPROFEN 600 MG PO TABS: 600.0000 mg | ORAL_TABLET | Freq: Three times a day (TID) | ORAL | 0 refills | Status: DC | PRN

## 2021-11-13 MED ORDER — FERROUS SULFATE 325 (65 FE) MG PO TBEC: 325.0000 mg | DELAYED_RELEASE_TABLET | ORAL | 2 refills | Status: DC

## 2021-11-13 NOTE — Discharge Instructions (Signed)
Congratulations!  Ok to stop taking metformin at this time

## 2021-11-13 NOTE — Lactation Note (Signed)
This note was copied from a baby's chart. Lactation Consultation Note  Patient Name: Kathy Browning [id number] Date: 11/13/2021 Reason for consult: Follow-up assessment;Mother's request;Exclusive pumping and bottle feeding;Term;Breastfeeding assistance Age:30 hours  Mom mostly formula feeding. Mom try pumping and offering EBM via bottle followed by formula.   Post pump after each feeding with use of manual pump for 10 mins each breast.  Feeding volume 15 ml or more per feeding.  All questions answered at the end of the visit.  Maternal Data Has patient been taught Hand Expression?: Yes  Feeding Mother's Current Feeding Choice: Breast Milk and Formula  LATCH Score                    Lactation Tools Discussed/Used Tools: Pump;Flanges Flange Size: 21 Breast pump type: Manual Pump Education: Setup, frequency, and cleaning;Milk Storage Reason for Pumping: increase stimulation Pumping frequency: post pump after each feeding for 15 mins  Interventions Interventions: Breast feeding basics reviewed;Hand express;Expressed milk;Ice;Hand pump;LC Services brochure;Infant Driven Feeding Algorithm education;Education  Discharge Discharge Education: Engorgement and breast care;Warning signs for feeding baby;Outpatient recommendation Pump: Manual (MOm to check with medical insurance about getting a pump for home.) [facility] Program: Yes  Consult Status Consult Status: Complete Date: 11/13/21 Follow-up type: In-patient    [name]  [name] 11/13/2021, 12:33 PM

## 2021-11-17 ENCOUNTER — Encounter: Payer: Self-pay | Admitting: Student

## 2021-11-19 ENCOUNTER — Telehealth (HOSPITAL_COMMUNITY): Payer: Self-pay

## 2021-11-19 NOTE — Telephone Encounter (Signed)
Patient reports feeling good. Patient declines questions/concerns about her health and healing.  Patient reports that baby is doing well. Eating, peeing/pooping, and gaining weight well. Baby sleeps in a bassinet. RN reviewed ABC's of safe sleep with patient. Patient declines any questions or concerns about baby.  EPDS score is 1.  Marcelino Duster [facility]  11/19/21,1102

## 2021-12-15 ENCOUNTER — Other Ambulatory Visit: Payer: Self-pay

## 2021-12-15 DIAGNOSIS — O24415 Gestational diabetes mellitus in pregnancy, controlled by oral hypoglycemic drugs: Secondary | ICD-10-CM

## 2021-12-15 DIAGNOSIS — Z3480 Encounter for supervision of other normal pregnancy, unspecified trimester: Secondary | ICD-10-CM

## 2021-12-20 ENCOUNTER — Ambulatory Visit (HOSPITAL_COMMUNITY)
Admission: EM | Admit: 2021-12-20 | Discharge: 2021-12-20 | Disposition: A | Discharge status: Home / Self Care | Payer: BC Managed Care – PPO | Attending: Family Medicine | Admitting: Family Medicine

## 2021-12-20 ENCOUNTER — Encounter (HOSPITAL_COMMUNITY): Payer: Self-pay

## 2021-12-20 DIAGNOSIS — U071 COVID-19: Secondary | ICD-10-CM | POA: Insufficient documentation

## 2021-12-20 DIAGNOSIS — J069 Acute upper respiratory infection, unspecified: Secondary | ICD-10-CM | POA: Diagnosis not present

## 2021-12-20 LAB — SARS CORONAVIRUS 2 (TAT 6-24 HRS): SARS Coronavirus 2: POSITIVE — AB

## 2021-12-20 LAB — POCT RAPID STREP A, ED / UC: Streptococcus, Group A Screen (Direct): NEGATIVE

## 2021-12-20 NOTE — ED Triage Notes (Signed)
Patient having sore throat, cough, fever, and body aches. Onset yesterday. Patient was aching all over, mainly in the neck and ribs area.  No known sick exposure. No one at home with similar symptoms.

## 2021-12-20 NOTE — ED Provider Notes (Signed)
[facility]    CSN: [id number] Arrival date & time: 12/20/21  1104      History   Chief Complaint Chief Complaint  Patient presents with   Sore Throat   Generalized Body Aches   Fever    HPI Kathy Browning is a 31 y.o. female.   Patient is here for uri symptoms since yesterday.  Started with sore throat, then mucousy cough, fatigue.  She then had with body aches, headache.  Felt warm.   Neck feels like there are swollen LN;  mild congestion.  No n/v.  Appetite is decreased overall since her baby (5 weeks).  No meds taken.  She is not breast feeding.    Past Medical History:  Diagnosis Date   Anemia     Patient Active Problem List   Diagnosis Date Noted   SVD (spontaneous vaginal delivery) 11/13/2021   Anemia, blood loss 11/13/2021   Indication for care in labor and delivery, antepartum 11/10/2021   Positive GBS test 10/25/2021   Polyhydramnios affecting pregnancy in third trimester 09/08/2021   Gestational diabetes 08/27/2021   Abnormal fetal ultrasound 06/24/2021   Supervision of other normal pregnancy, antepartum 04/19/2021   Low grade squamous intraepith lesion on cytologic smear cervix (lgsil) 11/05/2018    Past Surgical History:  Procedure Laterality Date   MOLE REMOVAL     TOE SURGERY      OB History     Gravida  3   Para  2   Term  2   Preterm      AB  1   Living  2      SAB  0   IAB  1   Ectopic      Multiple  0   Live Births  2            Home Medications    Prior to Admission medications   Medication Sig Start Date End Date Taking? Authorizing Provider  acetaminophen (TYLENOL) 500 MG tablet Take 500-1,000 mg by mouth as needed for headache.    [provider]  Calcium Carbonate Antacid (TUMS CHEWY BITES PO) Take 1 tablet by mouth as needed (heartburn).    [provider]  ferrous sulfate 325 (65 FE) MG EC tablet Take 1 tablet (325 mg total) by mouth every other day. 11/13/21 05/12/22  Ndulue,  Chiagoziem J, MD  fluticasone (FLONASE) 50 MCG/ACT nasal spray Place 2 sprays into both nostrils daily as needed for allergies or rhinitis.    [provider]  ibuprofen (ADVIL) 600 MG tablet Take 1 tablet (600 mg total) by mouth every 8 (eight) hours as needed. 11/13/21   Ndulue, Chiagoziem J, MD  Prenatal Vit-Fe Fumarate-FA (PREPLUS) 27-1 MG TABS Take 1 tablet by mouth daily. 06/01/21   Gerrit Heck, CNM  senna-docusate (SENOKOT-S) 8.6-50 MG tablet Take 2 tablets by mouth daily as needed for mild constipation. 11/13/21   Ndulue, Nadene Rubins, MD    Family History Family History  Problem Relation Age of Onset   Hypertension Mother    Hypertension Maternal Grandmother     Social History Social History   Tobacco Use   Smoking status: Former    Types: Cigarettes    Quit date: 03/29/2021    Years since quitting: 0.7    Passive exposure: Never   Smokeless tobacco: Never  Vaping Use   Vaping Use: Never used  Substance Use Topics   Alcohol use: Not Currently    Comment: not while preg  Drug use: Never     Allergies   Patient has no known allergies.   Review of Systems Review of Systems  Constitutional:  Positive for fever.  HENT:  Positive for congestion, rhinorrhea and sore throat.   Respiratory:  Positive for cough.   Cardiovascular: Negative.   Gastrointestinal: Negative.   Genitourinary: Negative.   Musculoskeletal: Negative.   Psychiatric/Behavioral: Negative.       Physical Exam Triage Vital Signs ED Triage Vitals  Enc Vitals Group     BP 12/20/21 1146 116/76     Pulse Rate 12/20/21 1146 85     Resp 12/20/21 1146 16     Temp 12/20/21 1146 98.8 F (37.1 C)     Temp Source 12/20/21 1146 Oral     SpO2 12/20/21 1146 100 %     Weight --      Height 12/20/21 1149 5\' 3"  (1.6 m)     Head Circumference --      Peak Flow --      Pain Score 12/20/21 1149 4     Pain Loc --      Pain Edu? --      Excl. in GC? --    No data found.  Updated Vital  Signs BP 116/76 (BP Location: Left Arm)   Pulse 85   Temp 98.8 F (37.1 C) (Oral)   Resp 16   Ht 5\' 3"  (1.6 m)   LMP 02/10/2021 (Exact Date)   SpO2 100%   Breastfeeding No   BMI 33.69 kg/m   Visual Acuity Right Eye Distance:   Left Eye Distance:   Bilateral Distance:    Right Eye Near:   Left Eye Near:    Bilateral Near:     Physical Exam Constitutional:      Appearance: She is well-developed.  HENT:     Head: Normocephalic.     Right Ear: Tympanic membrane normal.     Left Ear: Tympanic membrane normal.     Nose: Congestion and rhinorrhea present.     Mouth/Throat:     Pharynx: Posterior oropharyngeal erythema present. No oropharyngeal exudate.     Tonsils: No tonsillar exudate.  Cardiovascular:     Rate and Rhythm: Normal rate and regular rhythm.  Pulmonary:     Effort: Pulmonary effort is normal.     Breath sounds: Normal breath sounds.  Musculoskeletal:     Cervical back: Normal range of motion and neck supple.  Lymphadenopathy:     Cervical: Cervical adenopathy present.  Skin:    General: Skin is warm.  Neurological:     General: No focal deficit present.     Mental Status: She is alert.  Psychiatric:        Mood and Affect: Mood normal.      UC Treatments / Results  Labs (all labs ordered are listed, but only abnormal results are displayed) Labs Reviewed  POCT RAPID STREP A, ED / UC    EKG   Radiology No results found.  Procedures Procedures (including critical care time)  Medications Ordered in UC Medications - No data to display  Initial Impression / Assessment and Plan / UC Course  I have reviewed the triage vital signs and the nursing notes.  Pertinent labs & imaging results that were available during my care of the patient were reviewed by me and considered in my medical decision making (see chart for details).    Final Clinical Impressions(s) / UC Diagnoses   Final diagnoses:  Upper respiratory tract infection, unspecified  type     Discharge Instructions      You were seen today for upper respiratory symptoms.  Your strep test was negative.  We have tested you for covid today and will likely be resulted tomorrow.  I recommend you use tylenol or motrin for body aches and pain.  I recommend over the counter sudafed and claritin for sinus congestion and drainage.  If your covid test is positive we will notify you and discuss treatment.     ED Prescriptions   None    PDMP not reviewed this encounter.   Jannifer Franklin, MD 12/20/21 [phone]

## 2021-12-20 NOTE — Discharge Instructions (Addendum)
You were seen today for upper respiratory symptoms.  Your strep test was negative.  We have tested you for covid today and will likely be resulted tomorrow.  I recommend you use tylenol or motrin for body aches and pain.  I recommend over the counter sudafed and claritin for sinus congestion and drainage.  If your covid test is positive we will notify you and discuss treatment.

## 2021-12-21 ENCOUNTER — Other Ambulatory Visit: Payer: Self-pay | Admitting: *Deleted

## 2021-12-21 DIAGNOSIS — O24429 Gestational diabetes mellitus in childbirth, unspecified control: Secondary | ICD-10-CM

## 2021-12-22 ENCOUNTER — Encounter: Payer: Self-pay | Admitting: Family Medicine

## 2021-12-22 ENCOUNTER — Other Ambulatory Visit: Payer: Self-pay

## 2021-12-22 ENCOUNTER — Ambulatory Visit (INDEPENDENT_AMBULATORY_CARE_PROVIDER_SITE_OTHER): Payer: BC Managed Care – PPO | Admitting: Student

## 2021-12-22 ENCOUNTER — Other Ambulatory Visit (HOSPITAL_COMMUNITY)
Admission: RE | Admit: 2021-12-22 | Discharge: 2021-12-22 | Disposition: A | Discharge status: Home / Self Care | Payer: BC Managed Care – PPO | Source: Ambulatory Visit | Attending: Student | Admitting: Student

## 2021-12-22 ENCOUNTER — Other Ambulatory Visit: Payer: BC Managed Care – PPO

## 2021-12-22 ENCOUNTER — Encounter: Payer: Self-pay | Admitting: Student

## 2021-12-22 VITALS — BP 118/77 | HR 77 | Wt 155.6 lb

## 2021-12-22 DIAGNOSIS — Z124 Encounter for screening for malignant neoplasm of cervix: Secondary | ICD-10-CM | POA: Insufficient documentation

## 2021-12-22 DIAGNOSIS — Z3042 Encounter for surveillance of injectable contraceptive: Secondary | ICD-10-CM | POA: Diagnosis not present

## 2021-12-22 LAB — CULTURE, GROUP A STREP (THRC)

## 2021-12-22 MED ORDER — MEDROXYPROGESTERONE ACETATE 150 MG/ML IM SUSP
150.0000 mg | Freq: Once | INTRAMUSCULAR | Status: AC
  Administered 2021-12-22: 150 mg via INTRAMUSCULAR

## 2021-12-22 NOTE — Progress Notes (Signed)
Kathy Browning here for Depo-Provera Injection. Injection administered without complication. Patient will return in 3 months for next injection. Next annual visit due 11/2022.   Guy Begin, CMA 12/22/2021  1:46 PM

## 2021-12-22 NOTE — Addendum Note (Signed)
Addended by: Guy Begin on: 12/22/2021 01:49 PM   Modules accepted: Orders

## 2021-12-22 NOTE — Progress Notes (Signed)
Post Partum Visit Note  Kathy Browning is a 31 y.o. G98P2012 female who presents for a postpartum visit. She is 5 weeks postpartum following a normal spontaneous vaginal delivery.  I have fully reviewed the prenatal and intrapartum course. The delivery was at 39.2 gestational weeks.  Anesthesia: epidural. Postpartum course has been uneventful. Baby is doing well. Baby is feeding by bottle - Gerber Gentle . Bleeding  Started period . Bowel function is normal. Bladder function is normal. Patient is not sexually active. Contraception method is none. Postpartum depression screening: negative.   The pregnancy intention screening data noted above was reviewed. Potential methods of contraception were discussed. The patient elected to proceed with No data recorded.   Edinburgh Postnatal Depression Scale - 12/22/21 1142       Edinburgh Postnatal Depression Scale:  In the Past 7 Days   I have been able to laugh and see the funny side of things. 0    I have looked forward with enjoyment to things. 0    I have blamed myself unnecessarily when things went wrong. 0    I have been anxious or worried for no good reason. 0    I have felt scared or panicky for no good reason. 0    Things have been getting on top of me. 0    I have been so unhappy that I have had difficulty sleeping. 0    I have felt sad or miserable. 1    I have been so unhappy that I have been crying. 1    The thought of harming myself has occurred to me. 0    Edinburgh Postnatal Depression Scale Total 2             Health Maintenance Due  Topic Date Due   COVID-19 Vaccine (1) Never done   URINE MICROALBUMIN  Never done   PAP SMEAR-Modifier  07/08/2021   INFLUENZA VACCINE  11/22/2021    The following portions of the patient's history were reviewed and updated as appropriate: allergies, current medications, past family history, past medical history, past social history, past surgical history, and problem list.  Review of  Systems Pertinent items are noted in HPI.  Objective:  BP 118/77   Pulse 77   Wt 155 lb 9.6 oz (70.6 kg)   LMP 12/21/2021 (Exact Date)   Breastfeeding No   BMI 27.56 kg/m    General:  alert, cooperative, and appears stated age   Breasts:  normal  Lungs: clear to auscultation bilaterally  Heart:  regular rate and rhythm, S1, S2 normal, no murmur, click, rub or gallop  Abdomen: soft, non-tender; bowel sounds normal; no masses,  no organomegaly   Wound NA  GU exam:  normal       Assessment:   1. Screening for cervical cancer   2. Postpartum care and examination      Healthy postpartum exam.   Plan:   Essential components of care per ACOG recommendations:  1.  Mood and well being: Patient with negative depression screening today. Reviewed local resources for support.  - Patient tobacco use? No.   - hx of drug use? No.    2. Infant care and feeding:  -Patient currently breastmilk feeding? No.  -Social determinants of health (SDOH) reviewed in EPIC. No concerns  3. Sexuality, contraception and birth spacing - Patient does not want a pregnancy in the next year.  Desired family size is 2 children.  - Reviewed reproductive life planning.  Reviewed contraceptive methods based on pt preferences and effectiveness.  Patient desired Hormonal Injection today.   - Discussed birth spacing of 18 months  4. Sleep and fatigue -Encouraged family/partner/community support of 4 hrs of uninterrupted sleep to help with mood and fatigue  5. Physical Recovery  - Discussed patients delivery and complications. She describes her labor as good. - Patient had a Vaginal, no problems at delivery. Patient had a 2nd degree laceration. Perineal healing reviewed. Patient expressed understanding - Patient has urinary incontinence? No. - Patient is safe to resume physical and sexual activity  6.  Health Maintenance - HM due items addressed yes  - Last pap smear: Pap smear done at today's visit.   -Breast Cancer screening indicated? No.   7. Chronic Disease/Pregnancy Condition follow up: Gestational Diabetes  - PCP follow up  Marylene Land, CNM [facility], [facility]

## 2021-12-29 ENCOUNTER — Other Ambulatory Visit: Payer: Self-pay

## 2021-12-29 ENCOUNTER — Other Ambulatory Visit: Payer: BC Managed Care – PPO

## 2021-12-29 DIAGNOSIS — O24415 Gestational diabetes mellitus in pregnancy, controlled by oral hypoglycemic drugs: Secondary | ICD-10-CM

## 2021-12-29 LAB — CYTOLOGY - PAP
Comment: NEGATIVE
Comment: NEGATIVE
Comment: NEGATIVE
HPV 16: NEGATIVE
HPV 18 / 45: NEGATIVE
High risk HPV: POSITIVE — AB

## 2021-12-30 ENCOUNTER — Encounter: Payer: Self-pay | Admitting: Student

## 2022-01-05 DIAGNOSIS — R87612 Low grade squamous intraepithelial lesion on cytologic smear of cervix (LGSIL): Secondary | ICD-10-CM | POA: Insufficient documentation

## 2022-03-09 ENCOUNTER — Other Ambulatory Visit: Payer: Self-pay

## 2022-03-09 ENCOUNTER — Ambulatory Visit (INDEPENDENT_AMBULATORY_CARE_PROVIDER_SITE_OTHER): Payer: BC Managed Care – PPO | Admitting: *Deleted

## 2022-03-09 VITALS — BP 116/75 | HR 98 | Ht 63.0 in | Wt 147.2 lb

## 2022-03-09 DIAGNOSIS — Z3042 Encounter for surveillance of injectable contraceptive: Secondary | ICD-10-CM

## 2022-03-09 MED ORDER — MEDROXYPROGESTERONE ACETATE 150 MG/ML IM SUSP
150.0000 mg | Freq: Once | INTRAMUSCULAR | Status: AC
  Administered 2022-03-09: 150 mg via INTRAMUSCULAR

## 2022-03-09 NOTE — Progress Notes (Signed)
Kathy Browning here for Depo-Provera  Injection.  Injection administered without complication. Patient will return in 3 months 05/25/22- 06/08/22  for next injection. Annual due 12/24/22.  [name] 03/09/2022  3:43 PM

## 2022-04-28 ENCOUNTER — Ambulatory Visit (HOSPITAL_COMMUNITY)
Admission: EM | Admit: 2022-04-28 | Discharge: 2022-04-28 | Disposition: A | Discharge status: Home / Self Care | Payer: BC Managed Care – PPO | Attending: Family Medicine | Admitting: Family Medicine

## 2022-04-28 ENCOUNTER — Encounter (HOSPITAL_COMMUNITY): Payer: Self-pay | Admitting: Emergency Medicine

## 2022-04-28 DIAGNOSIS — R200 Anesthesia of skin: Secondary | ICD-10-CM | POA: Diagnosis not present

## 2022-04-28 DIAGNOSIS — R202 Paresthesia of skin: Secondary | ICD-10-CM | POA: Insufficient documentation

## 2022-04-28 DIAGNOSIS — M2550 Pain in unspecified joint: Secondary | ICD-10-CM | POA: Diagnosis not present

## 2022-04-28 LAB — CBC
HCT: 38.9 % (ref 36.0–46.0)
Hemoglobin: 13 g/dL (ref 12.0–15.0)
MCH: 30.5 pg (ref 26.0–34.0)
MCHC: 33.4 g/dL (ref 30.0–36.0)
MCV: 91.3 fL (ref 80.0–100.0)
Platelets: 375 10*3/uL (ref 150–400)
RBC: 4.26 MIL/uL (ref 3.87–5.11)
RDW: 14.3 % (ref 11.5–15.5)
WBC: 8 10*3/uL (ref 4.0–10.5)
nRBC: 0 % (ref 0.0–0.2)

## 2022-04-28 LAB — COMPREHENSIVE METABOLIC PANEL
ALT: 14 U/L (ref 0–44)
AST: 19 U/L (ref 15–41)
Albumin: 3.8 g/dL (ref 3.5–5.0)
Alkaline Phosphatase: 69 U/L (ref 38–126)
Anion gap: 9 (ref 5–15)
BUN: 8 mg/dL (ref 6–20)
CO2: 20 mmol/L — ABNORMAL LOW (ref 22–32)
Calcium: 9.2 mg/dL (ref 8.9–10.3)
Chloride: 107 mmol/L (ref 98–111)
Creatinine, Ser: 0.63 mg/dL (ref 0.44–1.00)
GFR, Estimated: >60 mL/min (ref >60)
Glucose, Bld: 79 mg/dL (ref 70–99)
Potassium: 4.1 mmol/L (ref 3.5–5.1)
Sodium: 136 mmol/L (ref 135–145)
Total Bilirubin: 0.7 mg/dL (ref 0.3–1.2)
Total Protein: 7.7 g/dL (ref 6.5–8.1)

## 2022-04-28 LAB — TSH: TSH: 1.141 u[IU]/mL (ref 0.350–4.500)

## 2022-04-28 MED ORDER — IBUPROFEN 800 MG PO TABS: 800.0000 mg | ORAL_TABLET | Freq: Three times a day (TID) | ORAL | 0 refills | Status: AC | PRN

## 2022-04-28 NOTE — Discharge Instructions (Signed)
We have drawn blood to check your blood counts, electrolytes, sugar, thyroid function, and for arthritis.  Staff will notify you if there is anything that needs treatment and is significantly abnormal  Try wearing the wrist splints to see if it helps your pain any  Take ibuprofen 800 mg--1 tab every 8 hours as needed for pain.  Please make a follow-up appointment with your primary care provider to follow-up on this issue and to see what other testing and evaluation you may need

## 2022-04-28 NOTE — ED Provider Notes (Signed)
[facility]    CSN: [id number] Arrival date & time: 04/28/22  1357      History   Chief Complaint Chief Complaint  Patient presents with   Numbness    HPI Kathy Browning is a 32 y.o. female.   HPI Here for arthralgia has been bothering her in various joints and areas in the last 5 months.  Sometimes her right wrist hurts sometimes her left wrist hurts.  Her hands can also hurt.  Sometimes her fingers lock up.  When she describes this to me it actually sounds like she might be having some carpal spasm.  She also can have some pain in either knee.  No fever and no rash.  No cough or congestion or abdominal pain.  No nausea or vomiting.  She has had some menstrual spotting lately.  She is on the Depo-Provera  She delivered a baby in July 2023.  She did have gestational diabetes and that pregnancy  Does have some numbness coming and going in her hands also  Past Medical History:  Diagnosis Date   Anemia     Patient Active Problem List   Diagnosis Date Noted   Low grade squamous intraepith lesion on cytologic smear cervix (lgsil) 01/05/2022   SVD (spontaneous vaginal delivery) 11/13/2021   Anemia, blood loss 11/13/2021   Indication for care in labor and delivery, antepartum 11/10/2021   Positive GBS test 10/25/2021   Polyhydramnios affecting pregnancy in third trimester 09/08/2021   Gestational diabetes 08/27/2021   Abnormal fetal ultrasound 06/24/2021    Past Surgical History:  Procedure Laterality Date   MOLE REMOVAL     TOE SURGERY      OB History     Gravida  3   Para  2   Term  2   Preterm      AB  1   Living  2      SAB  0   IAB  1   Ectopic      Multiple  0   Live Births  2            Home Medications    Prior to Admission medications   Medication Sig Start Date End Date Taking? Authorizing Provider  ibuprofen (ADVIL) 800 MG tablet Take 1 tablet (800 mg total) by mouth every 8 (eight) hours as needed (pain). 04/28/22  Yes  Barrett Henle, MD  acetaminophen (TYLENOL) 500 MG tablet Take 500-1,000 mg by mouth as needed for headache.    [provider]    Family History Family History  Problem Relation Age of Onset   Hypertension Mother    Hypertension Maternal Grandmother     Social History Social History   Tobacco Use   Smoking status: Former    Types: Cigarettes    Quit date: 03/29/2021    Years since quitting: 1.0    Passive exposure: Never   Smokeless tobacco: Never  Vaping Use   Vaping Use: Never used  Substance Use Topics   Alcohol use: Not Currently    Comment: not while preg   Drug use: Never     Allergies   Patient has no known allergies.   Review of Systems Review of Systems   Physical Exam Triage Vital Signs ED Triage Vitals  Enc Vitals Group     BP 04/28/22 1600 124/86     Pulse Rate 04/28/22 1600 67     Resp 04/28/22 1600 17     Temp 04/28/22  1600 99 F (37.2 C)     Temp src --      SpO2 04/28/22 1600 100 %     Weight --      Height --      Head Circumference --      Peak Flow --      Pain Score 04/28/22 1559 10     Pain Loc --      Pain Edu? --      Excl. in [place]? --    No data found.  Updated Vital Signs BP 124/86 (BP Location: Left Arm)   Pulse 67   Temp 99 F (37.2 C)   Resp 17   SpO2 100%   Breastfeeding No   Visual Acuity Right Eye Distance:   Left Eye Distance:   Bilateral Distance:    Right Eye Near:   Left Eye Near:    Bilateral Near:     Physical Exam Vitals reviewed.  Constitutional:      General: She is not in acute distress.    Appearance: She is not ill-appearing, toxic-appearing or diaphoretic.  HENT:     Mouth/Throat:     Mouth: Mucous membranes are moist.     Pharynx: No oropharyngeal exudate or posterior oropharyngeal erythema.  Eyes:     Extraocular Movements: Extraocular movements intact.     Conjunctiva/sclera: Conjunctivae normal.     Pupils: Pupils are equal, round, and reactive to light.  Neck:      Comments: Left lobe of her thyroid could be a little enlarged. Cardiovascular:     Rate and Rhythm: Normal rate and regular rhythm.     Heart sounds: No murmur heard. Pulmonary:     Effort: Pulmonary effort is normal. No respiratory distress.     Breath sounds: Normal breath sounds. No stridor. No wheezing, rhonchi or rales.  Musculoskeletal:        General: No swelling.     Cervical back: Neck supple.     Comments: Left wrist is currently tender.  The joint is not boggy nor is it erythematous or swollen.  Lymphadenopathy:     Cervical: No cervical adenopathy.  Skin:    Capillary Refill: Capillary refill takes less than 2 seconds.     Coloration: Skin is not jaundiced or pale.  Neurological:     General: No focal deficit present.     Mental Status: She is alert and oriented to person, place, and time.  Psychiatric:        Behavior: Behavior normal.      UC Treatments / Results  Labs (all labs ordered are listed, but only abnormal results are displayed) Labs Reviewed  CBC  COMPREHENSIVE METABOLIC PANEL  TSH  ANTINUCLEAR ANTIBODIES, IFA  RHEUMATOID FACTOR    EKG   Radiology No results found.  Procedures Procedures (including critical care time)  Medications Ordered in UC Medications - No data to display  Initial Impression / Assessment and Plan / UC Course  I have reviewed the triage vital signs and the nursing notes.  Pertinent labs & imaging results that were available during my care of the patient were reviewed by me and considered in my medical decision making (see chart for details).        Lab evaluation done to try to address her symptoms.  She is going to try the wrist splints to see if it makes any difference for her.  I have asked her to follow-up with her primary care Final Clinical Impressions(s) /  UC Diagnoses   Final diagnoses:  Paresthesia  Numbness  Arthralgia, unspecified joint     Discharge Instructions      We have drawn blood to  check your blood counts, electrolytes, sugar, thyroid function, and for arthritis.  Staff will notify you if there is anything that needs treatment and is significantly abnormal  Try wearing the wrist splints to see if it helps your pain any  Take ibuprofen 800 mg--1 tab every 8 hours as needed for pain.  Please make a follow-up appointment with your primary care provider to follow-up on this issue and to see what other testing and evaluation you may need     ED Prescriptions     Medication Sig Dispense Auth. Provider   ibuprofen (ADVIL) 800 MG tablet Take 1 tablet (800 mg total) by mouth every 8 (eight) hours as needed (pain). 21 tablet Dniyah Grant, Janace Aris, MD      PDMP not reviewed this encounter.   Zenia Resides, MD 04/28/22 1640

## 2022-04-28 NOTE — ED Triage Notes (Addendum)
Pt reports hands have numbness and pans that would lock up fingers since August. Reports has spread to other joints over body. Reports burning and locks.  Had gestational diabetes during pregnancy.  Tried taking Tylenol arthritis without relief.

## 2022-05-02 LAB — RHEUMATOID FACTOR: Rheumatoid fact SerPl-aCnc: 38.6 IU/mL — ABNORMAL HIGH (ref <14.0)

## 2022-05-02 LAB — ANTINUCLEAR ANTIBODIES, IFA: ANA Ab, IFA: NEGATIVE

## 2022-05-25 ENCOUNTER — Ambulatory Visit: Payer: BC Managed Care – PPO

## 2022-05-31 ENCOUNTER — Ambulatory Visit (INDEPENDENT_AMBULATORY_CARE_PROVIDER_SITE_OTHER): Payer: BC Managed Care – PPO

## 2022-05-31 VITALS — BP 119/80 | HR 75 | Wt 155.0 lb

## 2022-05-31 DIAGNOSIS — Z0000 Encounter for general adult medical examination without abnormal findings: Secondary | ICD-10-CM

## 2022-05-31 DIAGNOSIS — Z3042 Encounter for surveillance of injectable contraceptive: Secondary | ICD-10-CM

## 2022-05-31 MED ORDER — NORGESTIMATE-ETH ESTRADIOL 0.25-35 MG-MCG PO TABS: 1.0000 | ORAL_TABLET | Freq: Every day | ORAL | 11 refills | Status: AC

## 2022-05-31 NOTE — Progress Notes (Signed)
Pt here today for Depo Provera.  Pt states that she does not want Depo Provera any longer as today would be her third dose and she has been bleeding off and on since her delivery last year.  I explained to the pt that we usually encourage three injections before deciding as it may take the body that long to adjust.  Pt reports that her body just does not feel the same and she just wants to come off of it.  Pt requests BCP's.  I advised pt that she can start taking the pill as she is not late for her Depo injection.  I also let pt know that it can take up to three pill packs before her body is adjusted which can also cause irregular bleeding.  Pt verbalized understanding with no further questions.   Kathy Browning  05/31/22

## 2022-08-17 ENCOUNTER — Ambulatory Visit: Payer: BC Managed Care – PPO

## 2023-04-01 ENCOUNTER — Encounter: Payer: Self-pay | Admitting: Certified Nurse Midwife

## 2024-02-22 ENCOUNTER — Ambulatory Visit
Admission: RE | Admit: 2024-02-22 | Discharge: 2024-02-22 | Disposition: A | Discharge status: Home / Self Care | Source: Ambulatory Visit | Attending: Occupational Medicine | Admitting: Occupational Medicine

## 2024-02-22 ENCOUNTER — Other Ambulatory Visit: Payer: Self-pay | Admitting: Occupational Medicine

## 2024-02-22 ENCOUNTER — Ambulatory Visit
Admission: RE | Admit: 2024-02-22 | Discharge: 2024-02-22 | Disposition: A | Discharge status: Home / Self Care | Payer: Self-pay | Source: Ambulatory Visit | Attending: Occupational Medicine | Admitting: Occupational Medicine

## 2024-02-22 DIAGNOSIS — T148XXA Other injury of unspecified body region, initial encounter: Secondary | ICD-10-CM

## 2024-02-22 DIAGNOSIS — S9702XA Crushing injury of left ankle, initial encounter: Secondary | ICD-10-CM

## 2024-03-04 ENCOUNTER — Ambulatory Visit: Admitting: Family Medicine

## 2024-03-31 ENCOUNTER — Ambulatory Visit: Admitting: Obstetrics and Gynecology
